# Patient Record
Sex: Female | Born: 1990 | Race: White | Hispanic: No | Marital: Married | Smoking: Never smoker
Health system: Southern US, Community
[De-identification: ages and names within clinical notes are randomized; demographics above are authoritative.]

## PROBLEM LIST (undated history)

## (undated) ENCOUNTER — Inpatient Hospital Stay (HOSPITAL_COMMUNITY): Payer: Self-pay

## (undated) DIAGNOSIS — F32A Depression, unspecified: Secondary | ICD-10-CM

## (undated) DIAGNOSIS — F329 Major depressive disorder, single episode, unspecified: Secondary | ICD-10-CM

## (undated) DIAGNOSIS — R011 Cardiac murmur, unspecified: Secondary | ICD-10-CM

## (undated) HISTORY — PX: TYMPANOSTOMY TUBE PLACEMENT: SHX32

## (undated) HISTORY — PX: TONSILLECTOMY: SUR1361

## (undated) HISTORY — DX: Cardiac murmur, unspecified: R01.1

---
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
## 2011-01-12 LAB — URINALYSIS, ROUTINE W REFLEX MICROSCOPIC
Nitrite: POSITIVE — AB
Specific Gravity, Urine: 1.027 (ref 1.005–1.030)
Urobilinogen, UA: 1 mg/dL (ref 0.0–1.0)

## 2011-01-12 LAB — URINE MICROSCOPIC-ADD ON

## 2014-06-21 ENCOUNTER — Ambulatory Visit (INDEPENDENT_AMBULATORY_CARE_PROVIDER_SITE_OTHER): Payer: BC Managed Care – PPO | Admitting: Family Medicine

## 2014-06-21 VITALS — BP 92/60 | HR 91 | Temp 98.0°F | Resp 18 | Ht 63.0 in | Wt 116.0 lb

## 2014-06-21 DIAGNOSIS — R634 Abnormal weight loss: Secondary | ICD-10-CM

## 2014-06-21 DIAGNOSIS — Z124 Encounter for screening for malignant neoplasm of cervix: Secondary | ICD-10-CM

## 2014-06-21 DIAGNOSIS — Z30011 Encounter for initial prescription of contraceptive pills: Secondary | ICD-10-CM

## 2014-06-21 DIAGNOSIS — Z3009 Encounter for other general counseling and advice on contraception: Secondary | ICD-10-CM

## 2014-06-21 DIAGNOSIS — R109 Unspecified abdominal pain: Secondary | ICD-10-CM

## 2014-06-21 DIAGNOSIS — Z0000 Encounter for general adult medical examination without abnormal findings: Secondary | ICD-10-CM

## 2014-06-21 LAB — COMPREHENSIVE METABOLIC PANEL
ALT: 15 U/L (ref 0–35)
AST: 21 U/L (ref 0–37)
Albumin: 4.1 g/dL (ref 3.5–5.2)
Alkaline Phosphatase: 53 U/L (ref 39–117)
BILIRUBIN TOTAL: 0.4 mg/dL (ref 0.2–1.2)
BUN: 4 mg/dL — ABNORMAL LOW (ref 6–23)
CO2: 29 meq/L (ref 19–32)
CREATININE: 0.74 mg/dL (ref 0.50–1.10)
Calcium: 9 mg/dL (ref 8.4–10.5)
Chloride: 102 mEq/L (ref 96–112)
GLUCOSE: 84 mg/dL (ref 70–99)
Potassium: 3.8 mEq/L (ref 3.5–5.3)
Sodium: 138 mEq/L (ref 135–145)
Total Protein: 6.3 g/dL (ref 6.0–8.3)

## 2014-06-21 LAB — POCT WET PREP WITH KOH
KOH Prep POC: NEGATIVE
RBC WET PREP PER HPF POC: NEGATIVE
Trichomonas, UA: NEGATIVE
Yeast Wet Prep HPF POC: NEGATIVE

## 2014-06-21 LAB — POCT UA - MICROSCOPIC ONLY
CASTS, UR, LPF, POC: NEGATIVE
CRYSTALS, UR, HPF, POC: NEGATIVE
Yeast, UA: NEGATIVE

## 2014-06-21 LAB — POCT CBC
Granulocyte percent: 65.8 %G (ref 37–80)
HEMATOCRIT: 47.3 % (ref 37.7–47.9)
HEMOGLOBIN: 15.2 g/dL (ref 12.2–16.2)
LYMPH, POC: 1.6 (ref 0.6–3.4)
MCH: 29.4 pg (ref 27–31.2)
MCHC: 32.2 g/dL (ref 31.8–35.4)
MCV: 91.2 fL (ref 80–97)
MID (cbc): 0.2 (ref 0–0.9)
MPV: 7.1 fL (ref 0–99.8)
POC Granulocyte: 3.4 (ref 2–6.9)
POC LYMPH PERCENT: 30.8 %L (ref 10–50)
POC MID %: 3.4 %M (ref 0–12)
Platelet Count, POC: 257 10*3/uL (ref 142–424)
RBC: 5.18 M/uL (ref 4.04–5.48)
RDW, POC: 13.3 %
WBC: 5.1 10*3/uL (ref 4.6–10.2)

## 2014-06-21 LAB — POCT URINALYSIS DIPSTICK
Glucose, UA: NEGATIVE
LEUKOCYTES UA: NEGATIVE
Nitrite, UA: NEGATIVE
PROTEIN UA: 30
Spec Grav, UA: 1.025
Urobilinogen, UA: 1
pH, UA: 6

## 2014-06-21 LAB — TSH: TSH: 0.829 u[IU]/mL (ref 0.350–4.500)

## 2014-06-21 LAB — POCT URINE PREGNANCY: Preg Test, Ur: NEGATIVE

## 2014-06-21 MED ORDER — NORGESTIMATE-ETH ESTRADIOL 0.25-35 MG-MCG PO TABS: 1.0000 | ORAL_TABLET | Freq: Every day | ORAL | Status: DC

## 2014-06-21 MED ORDER — ONDANSETRON 4 MG PO TBDP: 4.0000 mg | ORAL_TABLET | Freq: Three times a day (TID) | ORAL | Status: DC | PRN

## 2014-06-21 NOTE — Patient Instructions (Addendum)
Take ranitidine 150 mg twice a day for 2 weeks, can continue longer if needed.   Gastritis, Adult Gastritis is soreness and swelling (inflammation) of the lining of the stomach. Gastritis can develop as a sudden onset (acute) or long-term (chronic) condition. If gastritis is not treated, it can lead to stomach bleeding and ulcers. CAUSES  Gastritis occurs when the stomach lining is weak or damaged. Digestive juices from the stomach then inflame the weakened stomach lining. The stomach lining may be weak or damaged due to viral or bacterial infections. One common bacterial infection is the Helicobacter pylori infection. Gastritis can also result from excessive alcohol consumption, taking certain medicines, or having too much acid in the stomach.  SYMPTOMS  In some cases, there are no symptoms. When symptoms are present, they may include:  Pain or a burning sensation in the upper abdomen.  Nausea.  Vomiting.  An uncomfortable feeling of fullness after eating. DIAGNOSIS  Your caregiver may suspect you have gastritis based on your symptoms and a physical exam. To determine the cause of your gastritis, your caregiver may perform the following:  Blood or stool tests to check for the H pylori bacterium.  Gastroscopy. A thin, flexible tube (endoscope) is passed down the esophagus and into the stomach. The endoscope has a light and camera on the end. Your caregiver uses the endoscope to view the inside of the stomach.  Taking a tissue sample (biopsy) from the stomach to examine under a microscope. TREATMENT  Depending on the cause of your gastritis, medicines may be prescribed. If you have a bacterial infection, such as an H pylori infection, antibiotics may be given. If your gastritis is caused by too much acid in the stomach, H2 blockers or antacids may be given. Your caregiver may recommend that you stop taking aspirin, ibuprofen, or other nonsteroidal anti-inflammatory drugs (NSAIDs). HOME CARE  INSTRUCTIONS  Only take over-the-counter or prescription medicines as directed by your caregiver.  If you were given antibiotic medicines, take them as directed. Finish them even if you start to feel better.  Drink enough fluids to keep your urine clear or pale yellow.  Avoid foods and drinks that make your symptoms worse, such as:  Caffeine or alcoholic drinks.  Chocolate.  Peppermint or mint flavorings.  Garlic and onions.  Spicy foods.  Citrus fruits, such as oranges, lemons, or limes.  Tomato-based foods such as sauce, chili, salsa, and pizza.  Fried and fatty foods.  Eat small, frequent meals instead of large meals. SEEK IMMEDIATE MEDICAL CARE IF:   You have black or dark red stools.  You vomit blood or material that looks like coffee grounds.  You are unable to keep fluids down.  Your abdominal pain gets worse.  You have a fever.  You do not feel better after 1 week.  You have any other questions or concerns. MAKE SURE YOU:  Understand these instructions.  Will watch your condition.  Will get help right away if you are not doing well or get worse. Document Released: 09/18/2001 Document Revised: 03/25/2012 Document Reviewed: 11/07/2011 [facility] Patient Information 2015 [place], [place]. This information is not intended to replace advice given to you by your health care provider. Make sure you discuss any questions you have with your health care provider. Oral Contraception Use Oral contraceptive pills (OCPs) are medicines taken to prevent pregnancy. OCPs work by preventing the ovaries from releasing eggs. The hormones in OCPs also cause the cervical mucus to thicken, preventing the sperm from entering  the uterus. The hormones also cause the uterine lining to become thin, not allowing a fertilized egg to attach to the inside of the uterus. OCPs are highly effective when taken exactly as prescribed. However, OCPs do not prevent sexually transmitted diseases  (STDs). Safe sex practices, such as using condoms along with an OCP, can help prevent STDs. Before taking OCPs, you may have a physical exam and Pap test. Your health care provider may also order blood tests if necessary. Your health care provider will make sure you are a good candidate for oral contraception. Discuss with your health care provider the possible side effects of the OCP you may be prescribed. When starting an OCP, it can take 2 to 3 months for the body to adjust to the changes in hormone levels in your body.  HOW TO TAKE ORAL CONTRACEPTIVE PILLS Your health care provider may advise you on how to start taking the first cycle of OCPs. Otherwise, you can:   Start on day 1 of your menstrual period. You will not need any backup contraceptive protection with this start time.   Start on the first Sunday after your menstrual period or the day you get your prescription. In these cases, you will need to use backup contraceptive protection for the first week.   Start the pill at any time of your cycle. If you take the pill within 5 days of the start of your period, you are protected against pregnancy right away. In this case, you will not need a backup form of birth control. If you start at any other time of your menstrual cycle, you will need to use another form of birth control for 7 days. If your OCP is the type called a minipill, it will protect you from pregnancy after taking it for 2 days (48 hours). After you have started taking OCPs:   If you forget to take 1 pill, take it as soon as you remember. Take the next pill at the regular time.   If you miss 2 or more pills, call your health care provider because different pills have different instructions for missed doses. Use backup birth control until your next menstrual period starts.   If you use a 28-day pack that contains inactive pills and you miss 1 of the last 7 pills (pills with no hormones), it will not matter. Throw away the rest  of the non-hormone pills and start a new pill pack.  No matter which day you start the OCP, you will always start a new pack on that same day of the week. Have an extra pack of OCPs and a backup contraceptive method available in case you miss some pills or lose your OCP pack.  HOME CARE INSTRUCTIONS   Do not smoke.   Always use a condom to protect against STDs. OCPs do not protect against STDs.   Use a calendar to mark your menstrual period days.   Read the information and directions that came with your OCP. Talk to your health care provider if you have questions.  SEEK MEDICAL CARE IF:   You develop nausea and vomiting.   You have abnormal vaginal discharge or bleeding.   You develop a rash.   You miss your menstrual period.   You are losing your hair.   You need treatment for mood swings or depression.   You get dizzy when taking the OCP.   You develop acne from taking the OCP.   You become pregnant.  SEEK  IMMEDIATE MEDICAL CARE IF:   You develop chest pain.   You develop shortness of breath.   You have an uncontrolled or severe headache.   You develop numbness or slurred speech.   You develop visual problems.   You develop pain, redness, and swelling in the legs.  Document Released: 09/13/2011 Document Revised: 02/08/2014 Document Reviewed: 03/15/2013 [facility] Patient Information 2015 [place], [place]. This information is not intended to replace advice given to you by your health care provider. Make sure you discuss any questions you have with your health care provider.
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
## 2014-06-22 LAB — PAP IG, CT-NG, RFX HPV ASCU
Chlamydia Probe Amp: NEGATIVE
GC Probe Amp: NEGATIVE

## 2014-06-23 ENCOUNTER — Telehealth: Payer: Self-pay

## 2014-06-23 NOTE — Telephone Encounter (Signed)
Lab did call with results- result notes. LM to RTC since symptoms have returned.

## 2014-06-23 NOTE — Telephone Encounter (Signed)
Pt wants to know if her lab results are back.  Also she is concerned because her symptoms have came back which include diarrhea and vomiting.  [phone]

## 2014-09-10 ENCOUNTER — Ambulatory Visit (INDEPENDENT_AMBULATORY_CARE_PROVIDER_SITE_OTHER): Payer: BC Managed Care – PPO | Admitting: Emergency Medicine

## 2014-09-10 VITALS — BP 102/58 | HR 85 | Temp 98.4°F | Resp 16 | Ht 62.5 in | Wt 117.0 lb

## 2014-09-10 DIAGNOSIS — R300 Dysuria: Secondary | ICD-10-CM

## 2014-09-10 DIAGNOSIS — R350 Frequency of micturition: Secondary | ICD-10-CM

## 2014-09-10 LAB — POCT UA - MICROSCOPIC ONLY
CRYSTALS, UR, HPF, POC: NEGATIVE
Casts, Ur, LPF, POC: NEGATIVE
Mucus, UA: NEGATIVE
YEAST UA: NEGATIVE

## 2014-09-10 LAB — POCT URINALYSIS DIPSTICK
Bilirubin, UA: NEGATIVE
Glucose, UA: NEGATIVE
Ketones, UA: NEGATIVE
NITRITE UA: POSITIVE
PH UA: 7.5
PROTEIN UA: 30
Spec Grav, UA: 1.02
Urobilinogen, UA: 1

## 2014-09-10 MED ORDER — NITROFURANTOIN MONOHYD MACRO 100 MG PO CAPS
100.0000 mg | ORAL_CAPSULE | Freq: Two times a day (BID) | ORAL | Status: DC
Start: 2014-09-10 — End: 2015-01-28

## 2014-09-10 NOTE — Patient Instructions (Signed)
Your urine sample was positive for a uti. Please take the macrobid twice daily for 5 days. Please return to clinic if your symptoms don't resolve or if you start to have fevers or increased low back/side pain.

## 2014-09-10 NOTE — Progress Notes (Signed)
   Subjective:    Patient ID: Jennifer Randall, female    DOB: 07-08-1991, 23 y.o.   MRN: [id number]  PCP: No PCP Per Patient  Chief Complaint  Patient presents with  . Dysuria    cloudy and odor, x 1 week  . Flank Pain  . Urinary Frequency   There are no active problems to display for this patient.  Prior to Admission medications   Medication Sig Start Date End Date Taking? Authorizing Provider  norgestimate-ethinyl estradiol (ORTHO-CYCLEN,SPRINTEC,PREVIFEM) 0.25-35 MG-MCG tablet Take 1 tablet by mouth daily. 06/21/14  Yes Elby Beck, FNP  ondansetron (ZOFRAN ODT) 4 MG disintegrating tablet Take 1 tablet (4 mg total) by mouth every 8 (eight) hours as needed for nausea or vomiting. 06/21/14  Yes Elby Beck, FNP  nitrofurantoin, macrocrystal-monohydrate, (MACROBID) 100 MG capsule Take 1 capsule (100 mg total) by mouth 2 (two) times daily. 09/10/14   Araceli Bouche, PA   Medications, allergies, past medical history, surgical history, family history, social history and problem list reviewed and updated.  HPI  23 yof no sig PMH presents with dysuria.  Sx started several days ago with cloudy urine and foul smelling urine. Then 2 days ago began to have dysuria, increased freq and urgency. Started period few days ago so unsure if hematuria.   She mentions low back pain moreso left sided for past few days, but states this is typical for her around the time of menses. No fever, chills. Takes ocp daily. Sexually active with fiancee. Pos nausea but no vomiting or diarrhea.   Review of Systems No CP, SOB.     Objective:   Physical Exam  Results for orders placed or performed in visit on 09/10/14  POCT UA - Microscopic Only  Result Value Ref Range   WBC, Ur, HPF, POC 10-15    RBC, urine, microscopic 5-7    Bacteria, U Microscopic 3+    Mucus, UA neg    Epithelial cells, urine per micros 3-7    Crystals, Ur, HPF, POC neg    Casts, Ur, LPF, POC neg    Yeast, UA neg   POCT  urinalysis dipstick  Result Value Ref Range   Color, UA yellow    Clarity, UA cloudy    Glucose, UA neg    Bilirubin, UA neg    Ketones, UA neg    Spec Grav, UA 1.020    Blood, UA moderate    pH, UA 7.5    Protein, UA 30    Urobilinogen, UA 1.0    Nitrite, UA positive    Leukocytes, UA small (1+)       Assessment & Plan:   23 yof no sig PMH presents with dysuria.  Urinary frequency - Plan: POCT UA - Microscopic Only, POCT urinalysis dipstick, nitrofurantoin, macrocrystal-monohydrate, (MACROBID) 100 MG capsule Dysuria - Plan: POCT UA - Microscopic Only, POCT urinalysis dipstick, nitrofurantoin, macrocrystal-monohydrate, (MACROBID) 100 MG capsule, Urine culture --nitrites and leuks on ua --macrobid as has sulfa allergy --urine cx sent --low concern for pyelo at this time, rtc instructions given  Julieta Gutting, PA-C Physician Assistant-Certified [facility]  09/10/2014 6:08 PM

## 2014-09-14 LAB — URINE CULTURE: Colony Count: 85000

## 2015-01-28 ENCOUNTER — Ambulatory Visit (INDEPENDENT_AMBULATORY_CARE_PROVIDER_SITE_OTHER): Payer: BC Managed Care – PPO | Admitting: Urgent Care

## 2015-01-28 VITALS — BP 94/60 | HR 73 | Temp 98.1°F | Resp 16 | Ht 62.5 in | Wt 114.4 lb

## 2015-01-28 DIAGNOSIS — R319 Hematuria, unspecified: Secondary | ICD-10-CM

## 2015-01-28 DIAGNOSIS — K5900 Constipation, unspecified: Secondary | ICD-10-CM

## 2015-01-28 DIAGNOSIS — N390 Urinary tract infection, site not specified: Secondary | ICD-10-CM

## 2015-01-28 DIAGNOSIS — R102 Pelvic and perineal pain: Secondary | ICD-10-CM | POA: Diagnosis not present

## 2015-01-28 DIAGNOSIS — F329 Major depressive disorder, single episode, unspecified: Secondary | ICD-10-CM

## 2015-01-28 DIAGNOSIS — F32A Depression, unspecified: Secondary | ICD-10-CM

## 2015-01-28 LAB — POCT CBC
GRANULOCYTE PERCENT: 58.4 % (ref 37–80)
HEMATOCRIT: 39.6 % (ref 37.7–47.9)
HEMOGLOBIN: 13 g/dL (ref 12.2–16.2)
Lymph, poc: 3 (ref 0.6–3.4)
MCH, POC: 29.3 pg (ref 27–31.2)
MCHC: 32.8 g/dL (ref 31.8–35.4)
MCV: 89.3 fL (ref 80–97)
MID (cbc): 0.4 (ref 0–0.9)
MPV: 7.9 fL (ref 0–99.8)
POC GRANULOCYTE: 4.7 (ref 2–6.9)
POC LYMPH PERCENT: 36.7 %L (ref 10–50)
POC MID %: 4.9 % (ref 0–12)
Platelet Count, POC: 293 10*3/uL (ref 142–424)
RBC: 4.43 M/uL (ref 4.04–5.48)
RDW, POC: 13.4 %
WBC: 8.1 10*3/uL (ref 4.6–10.2)

## 2015-01-28 LAB — POCT URINALYSIS DIPSTICK
Bilirubin, UA: NEGATIVE
GLUCOSE UA: NEGATIVE
KETONES UA: NEGATIVE
Leukocytes, UA: NEGATIVE
Nitrite, UA: NEGATIVE
SPEC GRAV UA: 1.02
Urobilinogen, UA: 0.2
pH, UA: 7

## 2015-01-28 LAB — POCT UA - MICROSCOPIC ONLY
CRYSTALS, UR, HPF, POC: NEGATIVE
Casts, Ur, LPF, POC: NEGATIVE
Yeast, UA: NEGATIVE

## 2015-01-28 LAB — POCT URINE PREGNANCY: PREG TEST UR: NEGATIVE

## 2015-01-28 MED ORDER — NITROFURANTOIN MONOHYD MACRO 100 MG PO CAPS
100.0000 mg | ORAL_CAPSULE | Freq: Two times a day (BID) | ORAL | Status: DC
Start: 2015-01-28 — End: 2015-07-25

## 2015-01-28 MED ORDER — DOCUSATE SODIUM 50 MG PO CAPS: 50.0000 mg | ORAL_CAPSULE | Freq: Two times a day (BID) | ORAL | Status: DC

## 2015-01-28 NOTE — Patient Instructions (Signed)
Urinary Tract Infection Urinary tract infections (UTIs) can develop anywhere along your urinary tract. Your urinary tract is your body's drainage system for removing wastes and extra water. Your urinary tract includes two kidneys, two ureters, a bladder, and a urethra. Your kidneys are a pair of bean-shaped organs. Each kidney is about the size of your fist. They are located below your ribs, one on each side of your spine. CAUSES Infections are caused by microbes, which are microscopic organisms, including fungi, viruses, and bacteria. These organisms are so small that they can only be seen through a microscope. Bacteria are the microbes that most commonly cause UTIs. SYMPTOMS  Symptoms of UTIs may vary by age and gender of the patient and by the location of the infection. Symptoms in young women typically include a frequent and intense urge to urinate and a painful, burning feeling in the bladder or urethra during urination. Older women and men are more likely to be tired, shaky, and weak and have muscle aches and abdominal pain. A fever may mean the infection is in your kidneys. Other symptoms of a kidney infection include pain in your back or sides below the ribs, nausea, and vomiting. DIAGNOSIS To diagnose a UTI, your caregiver will ask you about your symptoms. Your caregiver also will ask to provide a urine sample. The urine sample will be tested for bacteria and white blood cells. White blood cells are made by your body to help fight infection. TREATMENT  Typically, UTIs can be treated with medication. Because most UTIs are caused by a bacterial infection, they usually can be treated with the use of antibiotics. The choice of antibiotic and length of treatment depend on your symptoms and the type of bacteria causing your infection. HOME CARE INSTRUCTIONS  If you were prescribed antibiotics, take them exactly as your caregiver instructs you. Finish the medication even if you feel better after you  have only taken some of the medication.  Drink enough water and fluids to keep your urine clear or pale yellow.  Avoid caffeine, tea, and carbonated beverages. They tend to irritate your bladder.  Empty your bladder often. Avoid holding urine for long periods of time.  Empty your bladder before and after sexual intercourse.  After a bowel movement, women should cleanse from front to back. Use each tissue only once. SEEK MEDICAL CARE IF:   You have back pain.  You develop a fever.  Your symptoms do not begin to resolve within 3 days. SEEK IMMEDIATE MEDICAL CARE IF:   You have severe back pain or lower abdominal pain.  You develop chills.  You have nausea or vomiting.  You have continued burning or discomfort with urination. MAKE SURE YOU:   Understand these instructions.  Will watch your condition.  Will get help right away if you are not doing well or get worse. Document Released: 07/04/2005 Document Revised: 03/25/2012 Document Reviewed: 11/02/2011 [facility] Patient Information 2015 [place], [place]. This information is not intended to replace advice given to you by your health care provider. Make sure you discuss any questions you have with your health care provider.    Citalopram tablets What is this medicine? CITALOPRAM (sye TAL oh pram) is a medicine for depression. This medicine may be used for other purposes; ask your health care provider or pharmacist if you have questions. COMMON BRAND NAME(S): Celexa What should I tell my health care provider before I take this medicine? They need to know if you have any of these conditions: -bipolar  disorder or a family history of bipolar disorder -diabetes -glaucoma -heart disease -history of irregular heartbeat -kidney or liver disease -low levels of magnesium or potassium in the blood -receiving electroconvulsive therapy -seizures (convulsions) -suicidal thoughts or a previous suicide attempt -an unusual or allergic  reaction to citalopram, escitalopram, other medicines, foods, dyes, or preservatives -pregnant or trying to become pregnant -breast-feeding How should I use this medicine? Take this medicine by mouth with a glass of water. Follow the directions on the prescription label. You can take it with or without food. Take your medicine at regular intervals. Do not take your medicine more often than directed. Do not stop taking this medicine suddenly except upon the advice of your doctor. Stopping this medicine too quickly may cause serious side effects or your condition may worsen. A special MedGuide will be given to you by the pharmacist with each prescription and refill. Be sure to read this information carefully each time. Talk to your pediatrician regarding the use of this medicine in children. Special care may be needed. Patients over 73 years old may have a stronger reaction and need a smaller dose. Overdosage: If you think you have taken too much of this medicine contact a poison control center or emergency room at once. NOTE: This medicine is only for you. Do not share this medicine with others. What if I miss a dose? If you miss a dose, take it as soon as you can. If it is almost time for your next dose, take only that dose. Do not take double or extra doses. What may interact with this medicine? Do not take this medicine with any of the following medications: -certain medicines for fungal infections like fluconazole, itraconazole, ketoconazole, posaconazole, voriconazole -cisapride -dofetilide -dronedarone -escitalopram -linezolid -MAOIs like Carbex, Eldepryl, Marplan, Nardil, and Parnate -methylene blue (injected into a vein) -pimozide -thioridazine -ziprasidone This medicine may also interact with the following medications: -alcohol -aspirin and aspirin-like medicines -carbamazepine -certain medicines for depression, anxiety, or psychotic disturbances -certain medicines for  infections like chloroquine, clarithromycin, erythromycin, furazolidone, isoniazid, pentamidine -certain medicines for migraine headaches like almotriptan, eletriptan, frovatriptan, naratriptan, rizatriptan, sumatriptan, zolmitriptan -certain medicines for sleep -certain medicines that treat or prevent blood clots like dalteparin, enoxaparin, warfarin -cimetidine -diuretics -fentanyl -lithium -methadone -metoprolol -NSAIDs, medicines for pain and inflammation, like ibuprofen or naproxen -omeprazole -other medicines that prolong the QT interval (cause an abnormal heart rhythm) -procarbazine -rasagiline -supplements like St. John's wort, kava kava, valerian -tramadol -tryptophan This list may not describe all possible interactions. Give your health care provider a list of all the medicines, herbs, non-prescription drugs, or dietary supplements you use. Also tell them if you smoke, drink alcohol, or use illegal drugs. Some items may interact with your medicine. What should I watch for while using this medicine? Tell your doctor if your symptoms do not get better or if they get worse. Visit your doctor or health care professional for regular checks on your progress. Because it may take several weeks to see the full effects of this medicine, it is important to continue your treatment as prescribed by your doctor. Patients and their families should watch out for new or worsening thoughts of suicide or depression. Also watch out for sudden changes in feelings such as feeling anxious, agitated, panicky, irritable, hostile, aggressive, impulsive, severely restless, overly excited and hyperactive, or not being able to sleep. If this happens, especially at the beginning of treatment or after a change in dose, call your health care professional. You  may get drowsy or dizzy. Do not drive, use machinery, or do anything that needs mental alertness until you know how this medicine affects you. Do not stand or  sit up quickly, especially if you are an older patient. This reduces the risk of dizzy or fainting spells. Alcohol may interfere with the effect of this medicine. Avoid alcoholic drinks. Your mouth may get dry. Chewing sugarless gum or sucking hard candy, and drinking plenty of water will help. Contact your doctor if the problem does not go away or is severe. What side effects may I notice from receiving this medicine? Side effects that you should report to your doctor or health care professional as soon as possible: -allergic reactions like skin rash, itching or hives, swelling of the face, lips, or tongue -chest pain -confusion -dizziness -fast, irregular heartbeat -fast talking and excited feelings or actions that are out of control -feeling faint or lightheaded, falls -hallucination, loss of contact with reality -seizures -shortness of breath -suicidal thoughts or other mood changes -unusual bleeding or bruising Side effects that usually do not require medical attention (report to your doctor or health care professional if they continue or are bothersome): -blurred vision -change in appetite -change in sex drive or performance -headache -increased sweating -nausea -trouble sleeping This list may not describe all possible side effects. Call your doctor for medical advice about side effects. You may report side effects to FDA at 1-800-FDA-1088. Where should I keep my medicine? Keep out of reach of children. Store at room temperature between 15 and 30 degrees C (59 and 86 degrees F). Throw away any unused medicine after the expiration date. NOTE: This sheet is a summary. It may not cover all possible information. If you have questions about this medicine, talk to your doctor, pharmacist, or health care provider.  2015, [employer]vier/[employer] Standard. (2013-04-17 13:19:48)

## 2015-01-28 NOTE — Progress Notes (Signed)
MRN: [id number] DOB: 12-31-1990  Subjective:   Jennifer Randall is a 24 y.o. female presenting for chief complaint of Abdominal Cramping  Reports several month history of abdominal cramping in LUQ, typically associated with her menstrual cycles, intermittent nausea without vomiting. States that she has had full work up before within the past year including CT imaging, was told that LUQ pain might be due to inflammation associated with her cramps. Also admits hard stools, bowel movements every other day, does not exercise or eat regularly and eats a mixed diet sometimes has stretches with just fast food. Has not tried any medications for relief. Denies fevers, chest pain, shob, cough, vomiting, flank pain, bloody stools, diarrhea, dysuria, hematuria, vaginal irritation, genital rashes.  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 Her father was diagnosed with lung cancer (adenocarcinoma) on 04/24/2014 and passed away at the beginning of 01/2015. 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 Lastly, the patient is also engaged and has been trying to follow through with her wedding which she had put off due to her father's passing but is now scheduled for 06/2015. She has not sought counseling nor been offered any type of mood medication.  Denies any other aggravating or relieving factors, no other questions or concerns.  Rabab currently has no medications in their medication list. He is allergic to sulfa antibiotics.  Jayleene  has a past medical history of Heart murmur. Also  has past surgical history that includes Tonsillectomy and Tympanostomy tube placement.  ROS As in subjective.  Objective:   Vitals: BP 94/60 mmHg  Pulse 73  Temp(Src) 98.1 F (36.7 C) (Oral)  Resp 16  Ht 5' 2.5" (1.588 m)  Wt 114 lb 6 oz (51.88 kg)  BMI 20.57 kg/m2   SpO2 100%  LMP 01/21/2015  Physical Exam  Constitutional: She is oriented to person, place, and time and well-developed, well-nourished, and in no distress.  HENT:  Mouth/Throat: Oropharynx is clear and moist. No oropharyngeal exudate.  Neck: No thyromegaly present.  Cardiovascular: Normal rate, regular rhythm and intact distal pulses.  Exam reveals no gallop and no friction rub.   No murmur heard. Pulmonary/Chest: No respiratory distress. She has no wheezes. She has no rales. She exhibits no tenderness.  Abdominal: Soft. Bowel sounds are normal. She exhibits no distension and no mass. There is tenderness (pelvic tenderness). There is no rebound and no guarding.  Neurological: She is alert and oriented to person, place, and time.  Skin: Skin is warm and dry. No rash noted. No erythema. No pallor.  Psychiatric: Her mood appears not anxious. Her affect is not blunt, not labile and not inappropriate. She is not agitated. She exhibits a depressed mood (had difficulty multiple times throughout visit talking about her level of stress, became tearful as well). She expresses no homicidal and no suicidal ideation. She is not apathetic. She has a flat affect.   Results for orders placed or performed in visit on 01/28/15 (from the past 24 hour(s))  POCT urine pregnancy     Status: None   Collection Time: 01/28/15  7:04 PM  Result Value Ref Range   Preg Test, Ur Negative   POCT UA - Microscopic Only     Status: None   Collection Time: 01/28/15  7:06 PM  Result Value Ref Range  WBC, Ur, HPF, POC 0-2    RBC, urine, microscopic 7-12    Bacteria, U Microscopic trace    Mucus, UA moderate    Epithelial cells, urine per micros 0-4    Crystals, Ur, HPF, POC neg    Casts, Ur, LPF, POC neg    Yeast, UA neg   POCT urinalysis dipstick     Status: None   Collection Time: 01/28/15  7:06 PM  Result Value Ref Range   Color, UA yellow    Clarity, UA clear    Glucose, UA neg    Bilirubin, UA neg     Ketones, UA neg    Spec Grav, UA 1.020    Blood, UA tr-lysed    pH, UA 7.0    Protein, UA trace    Urobilinogen, UA 0.2    Nitrite, UA neg    Leukocytes, UA Negative   POCT CBC     Status: None   Collection Time: 01/28/15  7:06 PM  Result Value Ref Range   WBC 8.1 4.6 - 10.2 K/uL   Lymph, poc 3.0 0.6 - 3.4   POC LYMPH PERCENT 36.7 10 - 50 %L   MID (cbc) 0.4 0 - 0.9   POC MID % 4.9 0 - 12 %M   POC Granulocyte 4.7 2 - 6.9   Granulocyte percent 58.4 37 - 80 %G   RBC 4.43 4.04 - 5.48 M/uL   Hemoglobin 13.0 12.2 - 16.2 g/dL   HCT, POC 39.6 37.7 - 47.9 %   MCV 89.3 80 - 97 fL   MCH, POC 29.3 27 - 31.2 pg   MCHC 32.8 31.8 - 35.4 g/dL   RDW, POC 13.4 %   Platelet Count, POC 293 142 - 424 K/uL   MPV 7.9 0 - 99.8 fL   Assessment and Plan :   1. Pelvic pain in female 2. Urinary tract infection with hematuria, site unspecified - Will treat for UTI with Macrobid. Unfortunately, I did not order a UTI and will communicate this oversight to patient, will ask to recheck urinalysis in 2 weeks. - Consider untreated UTI if symptoms persist, STI, renal stone. - Unclear etiology for LUQ pain, may be psychosomatic, see below.   3. Constipation, unspecified constipation type - Labs pending, constipation may be a source for her LUQ pain, recommended dietary modifications including exercise which may help improve her mood as well.  - Start docusate twice daily as needed.  4. Depression - Multi-system complaints may be manifestation of underlying depression. Discussed at length options for SSRI treatment, benefits of counseling, will look into hospice care including counseling for family members. - Patient will consider trial of Citalopram, follow up with labs.  Jaynee Eagles, PA-C [facility] [phone] 01/28/2015 6:05 PM

## 2015-01-29 LAB — COMPREHENSIVE METABOLIC PANEL
ALT: 12 U/L (ref 0–35)
AST: 12 U/L (ref 0–37)
Albumin: 4.2 g/dL (ref 3.5–5.2)
Alkaline Phosphatase: 46 U/L (ref 39–117)
BUN: 16 mg/dL (ref 6–23)
CALCIUM: 9.2 mg/dL (ref 8.4–10.5)
CHLORIDE: 102 meq/L (ref 96–112)
CO2: 27 meq/L (ref 19–32)
CREATININE: 0.7 mg/dL (ref 0.50–1.10)
Glucose, Bld: 89 mg/dL (ref 70–99)
Potassium: 4 mEq/L (ref 3.5–5.3)
SODIUM: 137 meq/L (ref 135–145)
TOTAL PROTEIN: 6.5 g/dL (ref 6.0–8.3)
Total Bilirubin: 0.4 mg/dL (ref 0.2–1.2)

## 2015-01-30 ENCOUNTER — Telehealth: Payer: Self-pay | Admitting: Urgent Care

## 2015-01-30 NOTE — Telephone Encounter (Signed)
Reported normal CMET.

## 2015-02-01 ENCOUNTER — Encounter: Payer: Self-pay | Admitting: Urgent Care

## 2015-02-01 ENCOUNTER — Telehealth: Payer: Self-pay

## 2015-02-01 DIAGNOSIS — F410 Panic disorder [episodic paroxysmal anxiety] without agoraphobia: Secondary | ICD-10-CM

## 2015-02-01 DIAGNOSIS — F419 Anxiety disorder, unspecified: Secondary | ICD-10-CM

## 2015-02-01 MED ORDER — SERTRALINE HCL 50 MG PO TABS
50.0000 mg | ORAL_TABLET | Freq: Every day | ORAL | Status: DC
Start: 2015-02-01 — End: 2015-07-25

## 2015-02-01 MED ORDER — LORAZEPAM 0.5 MG PO TABS: 0.5000 mg | ORAL_TABLET | Freq: Two times a day (BID) | ORAL | Status: DC | PRN

## 2015-02-01 NOTE — Telephone Encounter (Signed)
Patient reports that she would like to start SSRI therapy for anxiety and bereavement. 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 She had to spend ~30 minutes on the phone with her mother to help her calm down. She reports that her family is very worried about her anxiety and mood. I counseled patient and told her I would help her with this as initially planned. Will start Zoloft, as her mother has previously done well with this medication. Will also provide patient with lorazepam for anxiety attacks. Counseled patient on potential for adverse effects including pregnancy risks. Patient verbalized understanding and agreed to trial of Zoloft. She is to start with 50mg , call back in 1 week, we will potentially increase by 25-50mg . Start lorazepam twice a day as needed.

## 2015-02-01 NOTE — Telephone Encounter (Signed)
Pt returned call to [facility]. She states she will be at this number for the rest of the day. Ph: [phone]

## 2015-05-26 ENCOUNTER — Telehealth: Payer: Self-pay

## 2015-05-26 NOTE — Telephone Encounter (Signed)
Left VM to call back 

## 2015-05-26 NOTE — Telephone Encounter (Signed)
Has questions about her medication

## 2015-05-27 NOTE — Telephone Encounter (Signed)
Pt has been on antidepressants since early may.  Pt is feeling anxious at times before, but she is wanting to know if she can wean herself off of this medication.  Her mood is much better now.  She feels that she is in a much better place emotionally and spiritually and wants to try to come off of these.  Bess Harvest, please advise recs for this pt.  thanks

## 2015-06-01 NOTE — Telephone Encounter (Signed)
Spoke with patient. I am very happy that she is doing significantly better with her anxiety/depression. 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 Patient agreed. She will check in with me in 1 month.

## 2015-07-25 ENCOUNTER — Ambulatory Visit (INDEPENDENT_AMBULATORY_CARE_PROVIDER_SITE_OTHER): Payer: BC Managed Care – PPO | Admitting: Family Medicine

## 2015-07-25 VITALS — BP 102/64 | HR 110 | Temp 98.6°F | Resp 18 | Ht 63.5 in | Wt 120.0 lb

## 2015-07-25 DIAGNOSIS — R300 Dysuria: Secondary | ICD-10-CM | POA: Diagnosis not present

## 2015-07-25 DIAGNOSIS — R319 Hematuria, unspecified: Secondary | ICD-10-CM

## 2015-07-25 DIAGNOSIS — N390 Urinary tract infection, site not specified: Secondary | ICD-10-CM | POA: Diagnosis not present

## 2015-07-25 DIAGNOSIS — F32A Depression, unspecified: Secondary | ICD-10-CM

## 2015-07-25 DIAGNOSIS — F329 Major depressive disorder, single episode, unspecified: Secondary | ICD-10-CM | POA: Diagnosis not present

## 2015-07-25 DIAGNOSIS — F419 Anxiety disorder, unspecified: Secondary | ICD-10-CM

## 2015-07-25 LAB — POC MICROSCOPIC URINALYSIS (UMFC): MUCUS RE: ABSENT

## 2015-07-25 LAB — POCT URINALYSIS DIP (MANUAL ENTRY)
Bilirubin, UA: NEGATIVE
GLUCOSE UA: NEGATIVE
Ketones, POC UA: NEGATIVE
NITRITE UA: POSITIVE — AB
PH UA: 7
PROTEIN UA: NEGATIVE
SPEC GRAV UA: 1.015
UROBILINOGEN UA: 0.2

## 2015-07-25 MED ORDER — NITROFURANTOIN MONOHYD MACRO 100 MG PO CAPS: 100.0000 mg | ORAL_CAPSULE | Freq: Two times a day (BID) | ORAL | Status: DC

## 2015-07-25 MED ORDER — SERTRALINE HCL 50 MG PO TABS: 50.0000 mg | ORAL_TABLET | Freq: Every day | ORAL | Status: DC

## 2015-07-25 NOTE — Patient Instructions (Signed)
Take the nitrofurantoin 100 mg 1 twice daily  Drink plenty of fluids  Always void after sexual intercourse as discussed  Continue your sertraline 1 daily. My recommendation is that you consider waiting until spring to discontinue this.  Plan see Rosario Adie PA-C in about January for a follow-up. You can call and see if you can get an appointment with him at [address]
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
## 2015-10-06 ENCOUNTER — Ambulatory Visit (INDEPENDENT_AMBULATORY_CARE_PROVIDER_SITE_OTHER): Payer: BC Managed Care – PPO | Admitting: Physician Assistant

## 2015-10-06 VITALS — BP 108/62 | HR 82 | Temp 98.7°F | Resp 16 | Ht 63.5 in | Wt 126.8 lb

## 2015-10-06 DIAGNOSIS — Z3201 Encounter for pregnancy test, result positive: Secondary | ICD-10-CM | POA: Diagnosis not present

## 2015-10-06 DIAGNOSIS — Z3200 Encounter for pregnancy test, result unknown: Secondary | ICD-10-CM

## 2015-10-06 DIAGNOSIS — Z3490 Encounter for supervision of normal pregnancy, unspecified, unspecified trimester: Secondary | ICD-10-CM

## 2015-10-06 LAB — POCT URINE PREGNANCY: Preg Test, Ur: POSITIVE — AB

## 2015-10-06 NOTE — Patient Instructions (Signed)
You can take tylenol during pregnancy, not ibuprofen All meats cooked well done No soft cheeses No deli meat Return if you have abdominal pain or vaginal bleeding.  [facility] - Dr. Prescott [facility] - Dr. Garwin Brothers, Dr. Ronita Hipps  First Trimester of Pregnancy The first trimester of pregnancy is from week 1 until the end of week 12 (months 1 through 3). A week after a sperm fertilizes an egg, the egg will implant on the wall of the uterus. This embryo will begin to develop into a baby. Genes from you and your partner are forming the baby. The female genes determine whether the baby is a boy or a girl. At 6-8 weeks, the eyes and face are formed, and the heartbeat can be seen on ultrasound. At the end of 12 weeks, all the baby's organs are formed.  Now that you are pregnant, you will want to do everything you can to have a healthy baby. Two of the most important things are to get good prenatal care and to follow your health care provider's instructions. Prenatal care is all the medical care you receive before the baby's birth. This care will help prevent, find, and treat any problems during the pregnancy and childbirth. BODY CHANGES Your body goes through many changes during pregnancy. The changes vary from woman to woman.   You may gain or lose a couple of pounds at first.  You may feel sick to your stomach (nauseous) and throw up (vomit). If the vomiting is uncontrollable, call your health care provider.  You may tire easily.  You may develop headaches that can be relieved by medicines approved by your health care provider.  You may urinate more often. Painful urination may mean you have a bladder infection.  You may develop heartburn as a result of your pregnancy.  You may develop constipation because certain hormones are causing the muscles that push waste through your intestines to slow down.  You may develop hemorrhoids or swollen, bulging veins (varicose  veins).  Your breasts may begin to grow larger and become tender. Your nipples may stick out more, and the tissue that surrounds them (areola) may become darker.  Your gums may bleed and may be sensitive to brushing and flossing.  Dark spots or blotches (chloasma, mask of pregnancy) may develop on your face. This will likely fade after the baby is born.  Your menstrual periods will stop.  You may have a loss of appetite.  You may develop cravings for certain kinds of food.  You may have changes in your emotions from day to day, such as being excited to be pregnant or being concerned that something may go wrong with the pregnancy and baby.  You may have more vivid and strange dreams.  You may have changes in your hair. These can include thickening of your hair, rapid growth, and changes in texture. Some women also have hair loss during or after pregnancy, or hair that feels dry or thin. Your hair will most likely return to normal after your baby is born. WHAT TO EXPECT AT YOUR PRENATAL VISITS During a routine prenatal visit:  You will be weighed to make sure you and the baby are growing normally.  Your blood pressure will be taken.  Your abdomen will be measured to track your baby's growth.  The fetal heartbeat will be listened to starting around week 10 or 12 of your pregnancy.  Test results from any previous visits will be discussed. Your health  care provider may ask you:  How you are feeling.  If you are feeling the baby move.  If you have had any abnormal symptoms, such as leaking fluid, bleeding, severe headaches, or abdominal cramping.  If you are using any tobacco products, including cigarettes, chewing tobacco, and electronic cigarettes.  If you have any questions. Other tests that may be performed during your first trimester include:  Blood tests to find your blood type and to check for the presence of any previous infections. They will also be used to check for  low iron levels (anemia) and Rh antibodies. Later in the pregnancy, blood tests for diabetes will be done along with other tests if problems develop.  Urine tests to check for infections, diabetes, or protein in the urine.  An ultrasound to confirm the proper growth and development of the baby.  An amniocentesis to check for possible genetic problems.  Fetal screens for spina bifida and Down syndrome.  You may need other tests to make sure you and the baby are doing well.  HIV (human immunodeficiency virus) testing. Routine prenatal testing includes screening for HIV, unless you choose not to have this test. HOME CARE INSTRUCTIONS  Medicines  Follow your health care provider's instructions regarding medicine use. Specific medicines may be either safe or unsafe to take during pregnancy.  Take your prenatal vitamins as directed.  If you develop constipation, try taking a stool softener if your health care provider approves. Diet  Eat regular, well-balanced meals. Choose a variety of foods, such as meat or vegetable-based protein, fish, milk and low-fat dairy products, vegetables, fruits, and whole grain breads and cereals. Your health care provider will help you determine the amount of weight gain that is right for you.  Avoid raw meat and uncooked cheese. These carry germs that can cause birth defects in the baby.  Eating four or five small meals rather than three large meals a day may help relieve nausea and vomiting. If you start to feel nauseous, eating a few soda crackers can be helpful. Drinking liquids between meals instead of during meals also seems to help nausea and vomiting.  If you develop constipation, eat more high-fiber foods, such as fresh vegetables or fruit and whole grains. Drink enough fluids to keep your urine clear or pale yellow. Activity and Exercise  Exercise only as directed by your health care provider. Exercising will help you:  Control your weight.  Stay  in shape.  Be prepared for labor and delivery.  Experiencing pain or cramping in the lower abdomen or low back is a good sign that you should stop exercising. Check with your health care provider before continuing normal exercises.  Try to avoid standing for long periods of time. Move your legs often if you must stand in one place for a long time.  Avoid heavy lifting.  Wear low-heeled shoes, and practice good posture.  You may continue to have sex unless your health care provider directs you otherwise. Relief of Pain or Discomfort  Wear a good support bra for breast tenderness.   Take warm sitz baths to soothe any pain or discomfort caused by hemorrhoids. Use hemorrhoid cream if your health care provider approves.   Rest with your legs elevated if you have leg cramps or low back pain.  If you develop varicose veins in your legs, wear support hose. Elevate your feet for 15 minutes, 3-4 times a day. Limit salt in your diet. Prenatal Care  Schedule your prenatal visits  by the twelfth week of pregnancy. They are usually scheduled monthly at first, then more often in the last 2 months before delivery.  Write down your questions. Take them to your prenatal visits.  Keep all your prenatal visits as directed by your health care provider. Safety  Wear your seat belt at all times when driving.  Make a list of emergency phone numbers, including numbers for family, friends, the hospital, and police and fire departments. General Tips  Ask your health care provider for a referral to a local prenatal education class. Begin classes no later than at the beginning of month 6 of your pregnancy.  Ask for help if you have counseling or nutritional needs during pregnancy. Your health care provider can offer advice or refer you to specialists for help with various needs.  Do not use hot tubs, steam rooms, or saunas.  Do not douche or use tampons or scented sanitary pads.  Do not cross your  legs for long periods of time.  Avoid cat litter boxes and soil used by cats. These carry germs that can cause birth defects in the baby and possibly loss of the fetus by miscarriage or stillbirth.  Avoid all smoking, herbs, alcohol, and medicines not prescribed by your health care provider. Chemicals in these affect the formation and growth of the baby.  Do not use any tobacco products, including cigarettes, chewing tobacco, and electronic cigarettes. If you need help quitting, ask your health care provider. You may receive counseling support and other resources to help you quit.  Schedule a dentist appointment. At home, brush your teeth with a soft toothbrush and be gentle when you floss. SEEK MEDICAL CARE IF:   You have dizziness.  You have mild pelvic cramps, pelvic pressure, or nagging pain in the abdominal area.  You have persistent nausea, vomiting, or diarrhea.  You have a bad smelling vaginal discharge.  You have pain with urination.  You notice increased swelling in your face, hands, legs, or ankles. SEEK IMMEDIATE MEDICAL CARE IF:   You have a fever.  You are leaking fluid from your vagina.  You have spotting or bleeding from your vagina.  You have severe abdominal cramping or pain.  You have rapid weight gain or loss.  You vomit blood or material that looks like coffee grounds.  You are exposed to [place] measles and have never had them.  You are exposed to fifth disease or chickenpox.  You develop a severe headache.  You have shortness of breath.  You have any kind of trauma, such as from a fall or a car accident.   This information is not intended to replace advice given to you by your health care provider. Make sure you discuss any questions you have with your health care provider.   Document Released: 09/18/2001 Document Revised: 10/15/2014 Document Reviewed: 08/04/2013 Elsevier Interactive Patient Education [employer].

## 2015-10-06 NOTE — Progress Notes (Signed)
[facility] [address] [place] [address] [phone]  Date:  10/06/2015   Name:  Jennifer Randall   DOB:  July 09, 1991   MRN:  [id number]  PCP:  No PCP Per Patient    Chief Complaint: pregnancy test   History of Present Illness:  This is a 24 y.o. female with PMH anxiety who is presenting after two positive home pregnancy tests. She is here with her significant other. This was a planned pregnancy and she is very excited about it. This is her first pregnancy. Only symptoms so far breast tenderness and some nausea yesterday. She had stopped all meds (zoloft, ativan) 2 weeks prior to finding out she was pregnant. She is doing well off these meds. She denies abdominal pain, vaginal bleeding. LMP 09/01/15 She is taking prenatal vitamins daily.  Review of Systems:  Review of Systems See HPI  There are no active problems to display for this patient.  Home meds: None   Allergies  Allergen Reactions  . Sulfa Antibiotics Hives    Past Surgical History  Procedure Laterality Date  . Tonsillectomy    . Tympanostomy tube placement      Social History  Substance Use Topics  . Smoking status: Never Smoker   . Smokeless tobacco: None  . Alcohol Use: No    Family History  Problem Relation Age of Onset  . Cancer Father   . Hyperlipidemia Maternal Grandmother     Medication list has been reviewed and updated.  Physical Examination:  Physical Exam  Constitutional: She is oriented to person, place, and time. She appears well-developed and well-nourished. No distress.  HENT:  Head: Normocephalic and atraumatic.  Right Ear: Hearing normal.  Left Ear: Hearing normal.  Nose: Nose normal.  Eyes: Conjunctivae and lids are normal. Right eye exhibits no discharge. Left eye exhibits no discharge. No scleral icterus.  Pulmonary/Chest: Effort normal. No respiratory distress.  Musculoskeletal: Normal range of motion.  Neurological: She is alert and  oriented to person, place, and time.  Skin: Skin is warm, dry and intact. No lesion and no rash noted.  Psychiatric: She has a normal mood and affect. Her speech is normal and behavior is normal. Thought content normal.    BP 108/62 mmHg  Pulse 82  Temp(Src) 98.7 F (37.1 C) (Oral)  Resp 16  Ht 5' 3.5" (1.613 m)  Wt 126 lb 12.8 oz (57.516 kg)  BMI 22.11 kg/m2  SpO2 99%  LMP 09/01/2015  Results for orders placed or performed in visit on 10/06/15  POCT urine pregnancy  Result Value Ref Range   Preg Test, Ur Positive (A) Negative    Assessment and Plan:  1. Pregnancy 2. Possible pregnancy Pregnancy test +. We discussed foods to avoid. We discussed symptoms to watch for. Gave contact information for OB/GYN practices. Return as needed. - POCT urine pregnancy   Benjaman Pott. Drenda Freeze, MHS [facility]  10/06/2015

## 2015-10-09 NOTE — L&D Delivery Note (Addendum)
Patient was C/C/+2 and pushed for 2 hours 45 minutes with epidural.    NSVD  female infant, Apgars 7,9, weight P.   The patient had a second degree midline episiotomy done to shorten last bit of pushing with tight hymen and in case of shoulder dystocia -repaired with 2-0 vicryl R. Fundus was firm. EBL was expected amount. Placenta was delivered intact. Vagina was clear.  Baby was vigorous and doing skin to skin with mother. Pt had fever during labor and treated with Amp for possible chorio- antibiotics discontinued for now.  Dre Gamino A

## 2015-11-16 LAB — OB RESULTS CONSOLE HEPATITIS B SURFACE ANTIGEN: HEP B S AG: NEGATIVE

## 2015-11-16 LAB — OB RESULTS CONSOLE GC/CHLAMYDIA
CHLAMYDIA, DNA PROBE: NEGATIVE
Gonorrhea: NEGATIVE

## 2015-11-16 LAB — OB RESULTS CONSOLE RUBELLA ANTIBODY, IGM: RUBELLA: IMMUNE

## 2015-11-16 LAB — OB RESULTS CONSOLE HIV ANTIBODY (ROUTINE TESTING): HIV: NONREACTIVE

## 2015-11-16 LAB — OB RESULTS CONSOLE RPR: RPR: NONREACTIVE

## 2016-03-14 ENCOUNTER — Ambulatory Visit (INDEPENDENT_AMBULATORY_CARE_PROVIDER_SITE_OTHER): Payer: BC Managed Care – PPO | Admitting: Physician Assistant

## 2016-03-14 VITALS — BP 108/68 | HR 81 | Temp 98.0°F | Resp 17 | Ht 63.5 in | Wt 144.0 lb

## 2016-03-14 DIAGNOSIS — R070 Pain in throat: Secondary | ICD-10-CM

## 2016-03-14 LAB — POCT RAPID STREP A (OFFICE): Rapid Strep A Screen: NEGATIVE

## 2016-03-14 NOTE — Patient Instructions (Addendum)
IF you received an x-ray today, you will receive an invoice from [facility]. Please contact [facility] at [phone] with questions or concerns regarding your invoice.   IF you received labwork today, you will receive an invoice from [employer]. Please contact [facility] at [phone] with questions or concerns regarding your invoice.   Our billing staff will not be able to assist you with questions regarding bills from these companies.  You will be contacted with the lab results as soon as they are available. The fastest way to get your results is to activate your My Chart account. Instructions are located on the last page of this paperwork. If you have not heard from [place] regarding the results in 2 weeks, please contact this office.    Please use nasal saline spray for your nose.  nedi pot would also be good.  You can use cepacol lozenges for the throat pain.   You can also use tylenol. Please stop the sudafed at this time. Upper Respiratory Infection, Adult Most upper respiratory infections (URIs) are a viral infection of the air passages leading to the lungs. A URI affects the nose, throat, and upper air passages. The most common type of URI is nasopharyngitis and is typically referred to as "the common cold." URIs run their course and usually go away on their own. Most of the time, a URI does not require medical attention, but sometimes a bacterial infection in the upper airways can follow a viral infection. This is called a secondary infection. Sinus and middle ear infections are common types of secondary upper respiratory infections. Bacterial pneumonia can also complicate a URI. A URI can worsen asthma and chronic obstructive pulmonary disease (COPD). Sometimes, these complications can require emergency medical care and may be life threatening.  CAUSES Almost all URIs are caused by viruses. A virus is a type of germ and can spread from  one person to another.  RISKS FACTORS You may be at risk for a URI if:   You smoke.   You have chronic heart or lung disease.  You have a weakened defense (immune) system.   You are very young or very old.   You have nasal allergies or asthma.  You work in crowded or poorly ventilated areas.  You work in health care facilities or schools. SIGNS AND SYMPTOMS  Symptoms typically develop 2-3 days after you come in contact with a cold virus. Most viral URIs last 7-10 days. However, viral URIs from the influenza virus (flu virus) can last 14-18 days and are typically more severe. Symptoms may include:   Runny or stuffy (congested) nose.   Sneezing.   Cough.   Sore throat.   Headache.   Fatigue.   Fever.   Loss of appetite.   Pain in your forehead, behind your eyes, and over your cheekbones (sinus pain).  Muscle aches.  DIAGNOSIS  Your health care provider may diagnose a URI by:  Physical exam.  Tests to check that your symptoms are not due to another condition such as:  Strep throat.  Sinusitis.  Pneumonia.  Asthma. TREATMENT  A URI goes away on its own with time. It cannot be cured with medicines, but medicines may be prescribed or recommended to relieve symptoms. Medicines may help:  Reduce your fever.  Reduce your cough.  Relieve nasal congestion. HOME CARE INSTRUCTIONS   Take medicines only as directed by your health care provider.   Gargle warm saltwater or take  cough drops to comfort your throat as directed by your health care provider.  Use a warm mist humidifier or inhale steam from a shower to increase air moisture. This may make it easier to breathe.  Drink enough fluid to keep your urine clear or pale yellow.   Eat soups and other clear broths and maintain good nutrition.   Rest as needed.   Return to work when your temperature has returned to normal or as your health care provider advises. You may need to stay home  longer to avoid infecting others. You can also use a face mask and careful hand washing to prevent spread of the virus.  Increase the usage of your inhaler if you have asthma.   Do not use any tobacco products, including cigarettes, chewing tobacco, or electronic cigarettes. If you need help quitting, ask your health care provider. PREVENTION  The best way to protect yourself from getting a cold is to practice good hygiene.   Avoid oral or hand contact with people with cold symptoms.   Wash your hands often if contact occurs.  There is no clear evidence that vitamin C, vitamin E, echinacea, or exercise reduces the chance of developing a cold. However, it is always recommended to get plenty of rest, exercise, and practice good nutrition.  SEEK MEDICAL CARE IF:   You are getting worse rather than better.   Your symptoms are not controlled by medicine.   You have chills.  You have worsening shortness of breath.  You have brown or red mucus.  You have yellow or brown nasal discharge.  You have pain in your face, especially when you bend forward.  You have a fever.  You have swollen neck glands.  You have pain while swallowing.  You have white areas in the back of your throat. SEEK IMMEDIATE MEDICAL CARE IF:   You have severe or persistent:  Headache.  Ear pain.  Sinus pain.  Chest pain.  You have chronic lung disease and any of the following:  Wheezing.  Prolonged cough.  Coughing up blood.  A change in your usual mucus.  You have a stiff neck.  You have changes in your:  Vision.  Hearing.  Thinking.  Mood. MAKE SURE YOU:   Understand these instructions.  Will watch your condition.  Will get help right away if you are not doing well or get worse.   This information is not intended to replace advice given to you by your health care provider. Make sure you discuss any questions you have with your health care provider.   Document Released:  03/20/2001 Document Revised: 02/08/2015 Document Reviewed: 12/30/2013 Elsevier Interactive Patient Education [employer].

## 2016-03-16 LAB — CULTURE, GROUP A STREP: Organism ID, Bacteria: NORMAL

## 2016-04-01 NOTE — Progress Notes (Signed)
[facility] [address] [place] [address] [phone]  Date:  03/14/2016   Name:  Jennifer Randall   DOB:  1991/06/20   MRN:  [id number]  PCP:  No PCP Per Patient    History of Present Illness:  Jennifer Randall is a 25 y.o. gravida female patient who presents to [facility] for cc of sore throat.   Patient has sore throat, cough, but without production.  No sob or dyspnea.  She noticed white in the back of her throat.  No abdominal pain or cramping.  She has done nothing for relief.    There are no active problems to display for this patient.   Past Medical History  Diagnosis Date  . Heart murmur     Past Surgical History  Procedure Laterality Date  . Tonsillectomy    . Tympanostomy tube placement      Social History  Substance Use Topics  . Smoking status: Never Smoker   . Smokeless tobacco: None  . Alcohol Use: No    Family History  Problem Relation Age of Onset  . Cancer Father   . Hyperlipidemia Maternal Grandmother     Allergies  Allergen Reactions  . Sulfa Antibiotics Hives    Medication list has been reviewed and updated.  No current outpatient prescriptions on file prior to visit.   No current facility-administered medications on file prior to visit.    ROS ROS otherwise unremarkable unless listed above.   Physical Examination: BP 108/68 mmHg  Pulse 81  Temp(Src) 98 F (36.7 C) (Oral)  Resp 17  Ht 5' 3.5" (1.613 m)  Wt 144 lb (65.318 kg)  BMI 25.11 kg/m2  SpO2 98%  LMP 09/01/2015 Ideal Body Weight: Weight in (lb) to have BMI = 25: 143.1  Physical Exam  Constitutional: She is oriented to person, place, and time. She appears well-developed and well-nourished. No distress.  HENT:  Head: Normocephalic and atraumatic.  Right Ear: Tympanic membrane, external ear and ear canal normal.  Left Ear: Tympanic membrane, external ear and ear canal normal.  Nose: No mucosal edema or rhinorrhea. Right sinus exhibits no  maxillary sinus tenderness and no frontal sinus tenderness. Left sinus exhibits no maxillary sinus tenderness and no frontal sinus tenderness.  Mouth/Throat: No uvula swelling. No oropharyngeal exudate, posterior oropharyngeal edema or posterior oropharyngeal erythema.  Eyes: Conjunctivae and EOM are normal. Pupils are equal, round, and reactive to light.  Cardiovascular: Normal rate and regular rhythm.  Exam reveals no gallop, no distant heart sounds and no friction rub.   No murmur heard. Pulmonary/Chest: Effort normal. No respiratory distress. She has no decreased breath sounds. She has no wheezes. She has no rhonchi.  Lymphadenopathy:       Head (right side): No submandibular, no tonsillar, no preauricular and no posterior auricular adenopathy present.       Head (left side): No submandibular, no tonsillar, no preauricular and no posterior auricular adenopathy present.  Neurological: She is alert and oriented to person, place, and time.  Skin: She is not diaphoretic.  Psychiatric: She has a normal mood and affect. Her behavior is normal.    Results for orders placed or performed in visit on 03/14/16  Culture, Group A Strep  Result Value Ref Range   Organism ID, Bacteria Normal Upper Respiratory Flora    Organism ID, Bacteria No Beta Hemolytic Streptococci Isolated   POCT rapid strep A  Result Value Ref Range   Rapid Strep A  Screen Negative Negative    Assessment and Plan: Jennifer Randall is a 25 y.o. female who is here today for throat pain. Advised tylenol, nasal saline spray, and nedi pot.   This appears to be post-nasal drip that is bothering the back of her throat.    Throat pain - Plan: POCT rapid strep A, Culture, Group A Strep   Ivar Drape, PA-C [facility] 04/01/2016 8:53 PM

## 2016-05-11 LAB — OB RESULTS CONSOLE GBS: GBS: NEGATIVE

## 2016-06-08 ENCOUNTER — Encounter (HOSPITAL_COMMUNITY): Payer: Self-pay | Admitting: *Deleted

## 2016-06-08 ENCOUNTER — Inpatient Hospital Stay (HOSPITAL_COMMUNITY)
Admission: AD | Admit: 2016-06-08 | Discharge: 2016-06-09 | Disposition: A | Discharge status: Home / Self Care | Payer: BC Managed Care – PPO | Source: Ambulatory Visit | Attending: Obstetrics and Gynecology | Admitting: Obstetrics and Gynecology

## 2016-06-08 DIAGNOSIS — Z3A40 40 weeks gestation of pregnancy: Secondary | ICD-10-CM | POA: Insufficient documentation

## 2016-06-08 DIAGNOSIS — O471 False labor at or after 37 completed weeks of gestation: Secondary | ICD-10-CM | POA: Insufficient documentation

## 2016-06-08 NOTE — MAU Note (Signed)
Contractions since 2000. Denies LOF or bleeding. 0.5cm last sve

## 2016-06-09 ENCOUNTER — Encounter (HOSPITAL_COMMUNITY): Payer: Self-pay

## 2016-06-09 NOTE — Progress Notes (Signed)
Report called to Dr. Harrington Challenger: G1, 40.2 wks, SVE 1/60/-3, contractions q 2-3 min; was 0.5 cm in the office yesterday. Pt may walk for an hour and be rechecked.

## 2016-06-09 NOTE — Progress Notes (Signed)
Orders received to D/C home with labor precautions.

## 2016-06-09 NOTE — MAU Note (Signed)
Pt back to room from walking; EFM reapplied

## 2016-06-10 ENCOUNTER — Inpatient Hospital Stay (HOSPITAL_COMMUNITY): Payer: BC Managed Care – PPO | Admitting: Anesthesiology

## 2016-06-10 ENCOUNTER — Inpatient Hospital Stay (HOSPITAL_COMMUNITY)
Admission: AD | Admit: 2016-06-10 | Discharge: 2016-06-14 | DRG: 775 | Disposition: A | Discharge status: Home / Self Care | Payer: BC Managed Care – PPO | Source: Ambulatory Visit | Attending: Obstetrics and Gynecology | Admitting: Obstetrics and Gynecology

## 2016-06-10 ENCOUNTER — Encounter (HOSPITAL_COMMUNITY): Payer: Self-pay | Admitting: Certified Nurse Midwife

## 2016-06-10 DIAGNOSIS — O411230 Chorioamnionitis, third trimester, not applicable or unspecified: Secondary | ICD-10-CM | POA: Diagnosis present

## 2016-06-10 DIAGNOSIS — Z3A40 40 weeks gestation of pregnancy: Secondary | ICD-10-CM | POA: Diagnosis not present

## 2016-06-10 DIAGNOSIS — Z3403 Encounter for supervision of normal first pregnancy, third trimester: Secondary | ICD-10-CM | POA: Diagnosis present

## 2016-06-10 LAB — CBC
HCT: 35.3 % — ABNORMAL LOW (ref 36.0–46.0)
HEMOGLOBIN: 12 g/dL (ref 12.0–15.0)
MCH: 29.9 pg (ref 26.0–34.0)
MCHC: 34 g/dL (ref 30.0–36.0)
MCV: 87.8 fL (ref 78.0–100.0)
PLATELETS: 127 10*3/uL — AB (ref 150–400)
RBC: 4.02 MIL/uL (ref 3.87–5.11)
RDW: 13.7 % (ref 11.5–15.5)
WBC: 11.1 10*3/uL — AB (ref 4.0–10.5)

## 2016-06-10 LAB — ABO/RH: ABO/RH(D): O POS

## 2016-06-10 LAB — TYPE AND SCREEN
ABO/RH(D): O POS
Antibody Screen: NEGATIVE

## 2016-06-10 MED ORDER — OXYCODONE-ACETAMINOPHEN 5-325 MG PO TABS
1.0000 | ORAL_TABLET | ORAL | Status: DC | PRN
  Filled 2016-06-10: qty 1

## 2016-06-10 MED ORDER — LIDOCAINE HCL (PF) 1 % IJ SOLN
30.0000 mL | INTRAMUSCULAR | Status: DC | PRN
  Administered 2016-06-12: 30 mL via SUBCUTANEOUS
  Filled 2016-06-10: qty 30

## 2016-06-10 MED ORDER — LACTATED RINGERS IV SOLN: 500.0000 mL | Freq: Once | INTRAVENOUS | Status: DC

## 2016-06-10 MED ORDER — FENTANYL 2.5 MCG/ML BUPIVACAINE 1/10 % EPIDURAL INFUSION (WH - ANES)
INTRAMUSCULAR | Status: AC
  Administered 2016-06-10: 14 mL/h via EPIDURAL
  Filled 2016-06-10: qty 125

## 2016-06-10 MED ORDER — EPHEDRINE 5 MG/ML INJ
10.0000 mg | INTRAVENOUS | Status: DC | PRN
  Filled 2016-06-10: qty 4

## 2016-06-10 MED ORDER — LACTATED RINGERS IV SOLN
500.0000 mL | INTRAVENOUS | Status: DC | PRN
  Administered 2016-06-11: 1000 mL via INTRAVENOUS

## 2016-06-10 MED ORDER — OXYTOCIN BOLUS FROM INFUSION
500.0000 mL | Freq: Once | INTRAVENOUS | Status: AC
  Administered 2016-06-12: 500 mL via INTRAVENOUS

## 2016-06-10 MED ORDER — SOD CITRATE-CITRIC ACID 500-334 MG/5ML PO SOLN
30.0000 mL | ORAL | Status: DC | PRN
  Administered 2016-06-11: 30 mL via ORAL
  Filled 2016-06-10: qty 15

## 2016-06-10 MED ORDER — OXYTOCIN 40 UNITS IN LACTATED RINGERS INFUSION - SIMPLE MED
1.0000 m[IU]/min | INTRAVENOUS | Status: DC
  Administered 2016-06-11: 1 m[IU]/min via INTRAVENOUS
  Filled 2016-06-10: qty 1000

## 2016-06-10 MED ORDER — PHENYLEPHRINE 40 MCG/ML (10ML) SYRINGE FOR IV PUSH (FOR BLOOD PRESSURE SUPPORT)
80.0000 ug | PREFILLED_SYRINGE | INTRAVENOUS | Status: DC | PRN
  Filled 2016-06-10: qty 5

## 2016-06-10 MED ORDER — OXYCODONE-ACETAMINOPHEN 5-325 MG PO TABS
2.0000 | ORAL_TABLET | ORAL | Status: DC | PRN
  Filled 2016-06-10: qty 2

## 2016-06-10 MED ORDER — LACTATED RINGERS IV SOLN
INTRAVENOUS | Status: DC
  Administered 2016-06-10 – 2016-06-11 (×4): via INTRAVENOUS

## 2016-06-10 MED ORDER — FLEET ENEMA 7-19 GM/118ML RE ENEM: 1.0000 | ENEMA | RECTAL | Status: DC | PRN

## 2016-06-10 MED ORDER — PHENYLEPHRINE 40 MCG/ML (10ML) SYRINGE FOR IV PUSH (FOR BLOOD PRESSURE SUPPORT)
PREFILLED_SYRINGE | INTRAVENOUS | Status: AC
  Filled 2016-06-10: qty 20

## 2016-06-10 MED ORDER — ACETAMINOPHEN 325 MG PO TABS
650.0000 mg | ORAL_TABLET | ORAL | Status: DC | PRN
  Administered 2016-06-11: 650 mg via ORAL
  Filled 2016-06-10: qty 2

## 2016-06-10 MED ORDER — OXYTOCIN 40 UNITS IN LACTATED RINGERS INFUSION - SIMPLE MED: 2.5000 [IU]/h | INTRAVENOUS | Status: DC

## 2016-06-10 MED ORDER — LIDOCAINE HCL (PF) 1 % IJ SOLN
INTRAMUSCULAR | Status: DC | PRN
  Administered 2016-06-10 (×2): 4 mL

## 2016-06-10 MED ORDER — DIPHENHYDRAMINE HCL 50 MG/ML IJ SOLN
12.5000 mg | INTRAMUSCULAR | Status: DC | PRN
  Administered 2016-06-11: 12.5 mg via INTRAVENOUS
  Filled 2016-06-10: qty 1

## 2016-06-10 MED ORDER — TERBUTALINE SULFATE 1 MG/ML IJ SOLN
0.2500 mg | Freq: Once | INTRAMUSCULAR | Status: DC | PRN
  Filled 2016-06-10: qty 1

## 2016-06-10 MED ORDER — PHENYLEPHRINE 40 MCG/ML (10ML) SYRINGE FOR IV PUSH (FOR BLOOD PRESSURE SUPPORT)
80.0000 ug | PREFILLED_SYRINGE | INTRAVENOUS | Status: DC | PRN
Start: 2016-06-10 — End: 2016-06-12
  Filled 2016-06-10: qty 5

## 2016-06-10 MED ORDER — ONDANSETRON HCL 4 MG/2ML IJ SOLN
4.0000 mg | Freq: Four times a day (QID) | INTRAMUSCULAR | Status: DC | PRN
  Administered 2016-06-11: 4 mg via INTRAVENOUS
  Filled 2016-06-10: qty 2

## 2016-06-10 MED ORDER — FENTANYL 2.5 MCG/ML BUPIVACAINE 1/10 % EPIDURAL INFUSION (WH - ANES)
14.0000 mL/h | INTRAMUSCULAR | Status: DC | PRN
  Administered 2016-06-10 – 2016-06-11 (×5): 14 mL/h via EPIDURAL
  Filled 2016-06-10 (×3): qty 125

## 2016-06-10 NOTE — Anesthesia Pain Management Evaluation Note (Signed)
  CRNA Pain Management Visit Note  Patient: Jennifer Randall, 25 y.o., female  "Hello I am a member of the anesthesia team at [facility]. We have an anesthesia team available at all times to provide care throughout the hospital, including epidural management and anesthesia for C-section. I don't know your plan for the delivery whether it a natural birth, water birth, IV sedation, nitrous supplementation, doula or epidural, but we want to meet your pain goals."   1.Was your pain managed to your expectations on prior hospitalizations?   No prior hospitalizations  2.What is your expectation for pain management during this hospitalization?     Epidural  3.How can we help you reach that goal? Epidural   Record the patient's initial score and the patient's pain goal.   Pain: 6  Pain Goal: 6 [facility] wants you to be able to say your pain was always managed very well.  Lynell Greenhouse 06/10/2016

## 2016-06-10 NOTE — MAU Note (Signed)
Pt states she has been contracting since 1000 this morning and they are now 2 minutes apart.  Pt states she lost her mucus plug but denies bleeding or leaking of fluid.

## 2016-06-10 NOTE — Anesthesia Procedure Notes (Signed)
Epidural Patient location during procedure: OB  Staffing Anesthesiologist: Lauretta Grill Performed: anesthesiologist   Preanesthetic Checklist Completed: patient identified, site marked, surgical consent, pre-op evaluation, timeout performed, IV checked, risks and benefits discussed and monitors and equipment checked  Epidural Patient position: sitting Prep: site prepped and draped and DuraPrep Patient monitoring: continuous pulse ox and blood pressure Approach: midline Location: L3-L4 Injection technique: LOR saline  Needle:  Needle type: Tuohy  Needle gauge: 17 G Needle length: 9 cm and 9 Needle insertion depth: 5 cm cm Catheter type: closed end flexible Catheter size: 19 Gauge Catheter at skin depth: 10 cm Test dose: negative  Assessment Events: blood not aspirated, injection not painful, no injection resistance, negative IV test and no paresthesia  Additional Notes Patient identified. Risks/Benefits/Options discussed with patient including but not limited to bleeding, infection, nerve damage, paralysis, failed block, incomplete pain control, headache, blood pressure changes, nausea, vomiting, reactions to medication both or allergic, itching and postpartum back pain. Confirmed with bedside nurse the patient's most recent platelet count. Confirmed with patient that they are not currently taking any anticoagulation, have any bleeding history or any family history of bleeding disorders. Patient expressed understanding and wished to proceed. All questions were answered. Sterile technique was used throughout the entire procedure. Please see nursing notes for vital signs. Test dose was given through epidural catheter and negative prior to continuing to dose epidural or start infusion. Warning signs of high block given to the patient including shortness of breath, tingling/numbness in hands, complete motor block, or any concerning symptoms with instructions to call for help. Patient was  given instructions on fall risk and not to get out of bed. All questions and concerns addressed with instructions to call with any issues or inadequate analgesia.

## 2016-06-10 NOTE — Anesthesia Preprocedure Evaluation (Signed)
Anesthesia Evaluation  Patient identified by MRN, date of birth, ID band Patient awake    Reviewed: Allergy & Precautions, NPO status , Patient's Chart, lab work & pertinent test results  Airway Mallampati: II  TM Distance: >3 FB Neck ROM: Full    Dental no notable dental hx. (+) Dental Advisory Given   Pulmonary neg pulmonary ROS,    Pulmonary exam normal breath sounds clear to auscultation       Cardiovascular negative cardio ROS Normal cardiovascular exam+ Valvular Problems/Murmurs (reports PACs)  Rhythm:Regular Rate:Normal     Neuro/Psych negative neurological ROS  negative psych ROS   GI/Hepatic negative GI ROS, Neg liver ROS,   Endo/Other  negative endocrine ROS  Renal/GU negative Renal ROS  negative genitourinary   Musculoskeletal negative musculoskeletal ROS (+)   Abdominal   Peds negative pediatric ROS (+)  Hematology negative hematology ROS (+)   Anesthesia Other Findings   Reproductive/Obstetrics (+) Pregnancy                             Anesthesia Physical Anesthesia Plan  ASA: II  Anesthesia Plan: Epidural   Post-op Pain Management:    Induction:   Airway Management Planned:   Additional Equipment:   Intra-op Plan:   Post-operative Plan:   Informed Consent: I have reviewed the patients History and Physical, chart, labs and discussed the procedure including the risks, benefits and alternatives for the proposed anesthesia with the patient or authorized representative who has indicated his/her understanding and acceptance.   Dental advisory given  Plan Discussed with: CRNA  Anesthesia Plan Comments:         Anesthesia Quick Evaluation

## 2016-06-10 NOTE — H&P (Signed)
Jennifer Randall is a 25 y.o. female presenting for labor  Jennifer Randall is a 25 yo G1P0 @ 40+3 who presents to MAU complaining of painful contractions. Upon presentation the patient presented to the MAU desk and was painfully contracting and felling pressure as if she needed to push.She was quickly placed in a room and was found rto be 1 cm dilated. In MAU she changed her cervix to 3cm and was admitted  OB History    Gravida Para Term Preterm AB Living   1             SAB TAB Ectopic Multiple Live Births                 Past Medical History:  Diagnosis Date  . Heart murmur    Past Surgical History:  Procedure Laterality Date  . TONSILLECTOMY    . TYMPANOSTOMY TUBE PLACEMENT     Family History: family history includes Cancer in her father; Hyperlipidemia in her maternal grandmother. Social History:  reports that she has never smoked. She has never used smokeless tobacco. She reports that she does not drink alcohol or use drugs.     Maternal Diabetes: No Genetic Screening: Declined Maternal Ultrasounds/Referrals: Normal Fetal Ultrasounds or other Referrals:  None Maternal Substance Abuse:  No Significant Maternal Medications:  None Significant Maternal Lab Results:  None Other Comments:  None  ROS History Dilation: 3 Effacement (%): 60 Station: -3 Exam by:: ARonnald Ramp RNC Blood pressure 114/79, pulse 85, temperature 97.9 F (36.6 C), temperature source Oral, resp. rate (!) 30, height 5\' 3"  (1.6 m), weight 77.6 kg (171 lb), last menstrual period 09/01/2015. Exam Physical Exam  Prenatal labs: ABO, Rh:  O+ Antibody:  Negative Rubella:  immune RPR:   NR HBsAg:   Neg HIV:   NR GBS:   Neg Assessment/Plan: 1) Admit 2) Epidural on request    Jennifer Randall H. 06/10/2016, 5:34 PM

## 2016-06-11 ENCOUNTER — Encounter (HOSPITAL_COMMUNITY): Payer: Self-pay | Admitting: *Deleted

## 2016-06-11 LAB — RPR: RPR: NONREACTIVE

## 2016-06-11 MED ORDER — SODIUM BICARBONATE 8.4 % IV SOLN
INTRAVENOUS | Status: DC | PRN
  Administered 2016-06-11: 6 mL via EPIDURAL

## 2016-06-11 MED ORDER — SODIUM CHLORIDE 0.9 % IV SOLN
2.0000 g | Freq: Four times a day (QID) | INTRAVENOUS | Status: DC
  Administered 2016-06-11: 2 g via INTRAVENOUS
  Filled 2016-06-11 (×3): qty 2000

## 2016-06-11 NOTE — Progress Notes (Signed)
Called for increased pain.  Patient reports that her abdomen is comfortable but increasing pain in tailbone and vagina.  Dosed with Lidocaine 2% with epinephrine  (6cc) at about 2145.  Checked on patient at 2215 and she is now comfortable.

## 2016-06-11 NOTE — Progress Notes (Signed)
Update Pt had been seen this am with normal and reassuring FHTS.   4/60/-2, AROM clear    FHTs 120s, gSTV, NST R; runs of earlies with occasional lates for 20-30 minutes then back to earlies. Conitnue unless lates persist.

## 2016-06-12 ENCOUNTER — Encounter (HOSPITAL_COMMUNITY): Payer: Self-pay

## 2016-06-12 MED ORDER — DIBUCAINE 1 % RE OINT: 1.0000 "application " | TOPICAL_OINTMENT | RECTAL | Status: DC | PRN

## 2016-06-12 MED ORDER — SODIUM CHLORIDE 0.9 % IV SOLN: 250.0000 mL | INTRAVENOUS | Status: DC | PRN

## 2016-06-12 MED ORDER — SODIUM CHLORIDE 0.9% FLUSH: 3.0000 mL | INTRAVENOUS | Status: DC | PRN

## 2016-06-12 MED ORDER — OXYCODONE-ACETAMINOPHEN 5-325 MG PO TABS
1.0000 | ORAL_TABLET | ORAL | Status: DC | PRN
  Filled 2016-06-12: qty 1

## 2016-06-12 MED ORDER — FERROUS SULFATE 325 (65 FE) MG PO TABS
325.0000 mg | ORAL_TABLET | Freq: Two times a day (BID) | ORAL | Status: DC
  Administered 2016-06-12 – 2016-06-13 (×4): 325 mg via ORAL
  Filled 2016-06-12 (×4): qty 1

## 2016-06-12 MED ORDER — ZOLPIDEM TARTRATE 5 MG PO TABS: 5.0000 mg | ORAL_TABLET | Freq: Every evening | ORAL | Status: DC | PRN

## 2016-06-12 MED ORDER — ONDANSETRON HCL 4 MG/2ML IJ SOLN: 4.0000 mg | INTRAMUSCULAR | Status: DC | PRN

## 2016-06-12 MED ORDER — SENNOSIDES-DOCUSATE SODIUM 8.6-50 MG PO TABS
2.0000 | ORAL_TABLET | ORAL | Status: DC
  Administered 2016-06-12 – 2016-06-14 (×2): 2 via ORAL
  Filled 2016-06-12 (×2): qty 2

## 2016-06-12 MED ORDER — METHYLERGONOVINE MALEATE 0.2 MG/ML IJ SOLN: 0.2000 mg | INTRAMUSCULAR | Status: DC | PRN

## 2016-06-12 MED ORDER — WITCH HAZEL-GLYCERIN EX PADS: 1.0000 "application " | MEDICATED_PAD | CUTANEOUS | Status: DC | PRN

## 2016-06-12 MED ORDER — ACETAMINOPHEN 325 MG PO TABS: 650.0000 mg | ORAL_TABLET | ORAL | Status: DC | PRN

## 2016-06-12 MED ORDER — ONDANSETRON HCL 4 MG PO TABS
4.0000 mg | ORAL_TABLET | ORAL | Status: DC | PRN
Start: 2016-06-12 — End: 2016-06-14

## 2016-06-12 MED ORDER — IBUPROFEN 800 MG PO TABS
800.0000 mg | ORAL_TABLET | Freq: Three times a day (TID) | ORAL | Status: DC
  Administered 2016-06-12 – 2016-06-14 (×7): 800 mg via ORAL
  Filled 2016-06-12 (×7): qty 1

## 2016-06-12 MED ORDER — TETANUS-DIPHTH-ACELL PERTUSSIS 5-2.5-18.5 LF-MCG/0.5 IM SUSP: 0.5000 mL | Freq: Once | INTRAMUSCULAR | Status: DC

## 2016-06-12 MED ORDER — COCONUT OIL OIL: 1.0000 "application " | TOPICAL_OIL | Status: DC | PRN

## 2016-06-12 MED ORDER — PRENATAL MULTIVITAMIN CH
1.0000 | ORAL_TABLET | Freq: Every day | ORAL | Status: DC
  Administered 2016-06-12 – 2016-06-13 (×2): 1 via ORAL
  Filled 2016-06-12 (×2): qty 1

## 2016-06-12 MED ORDER — DIPHENHYDRAMINE HCL 25 MG PO CAPS: 25.0000 mg | ORAL_CAPSULE | Freq: Four times a day (QID) | ORAL | Status: DC | PRN

## 2016-06-12 MED ORDER — MAGNESIUM HYDROXIDE 400 MG/5ML PO SUSP: 30.0000 mL | ORAL | Status: DC | PRN

## 2016-06-12 MED ORDER — SIMETHICONE 80 MG PO CHEW: 80.0000 mg | CHEWABLE_TABLET | ORAL | Status: DC | PRN

## 2016-06-12 MED ORDER — OXYCODONE-ACETAMINOPHEN 5-325 MG PO TABS
2.0000 | ORAL_TABLET | ORAL | Status: DC | PRN
  Administered 2016-06-12: 2 via ORAL

## 2016-06-12 MED ORDER — SODIUM CHLORIDE 0.9% FLUSH: 3.0000 mL | Freq: Two times a day (BID) | INTRAVENOUS | Status: DC

## 2016-06-12 MED ORDER — METHYLERGONOVINE MALEATE 0.2 MG PO TABS: 0.2000 mg | ORAL_TABLET | ORAL | Status: DC | PRN

## 2016-06-12 MED ORDER — MEASLES, MUMPS & RUBELLA VAC ~~LOC~~ INJ
0.5000 mL | INJECTION | Freq: Once | SUBCUTANEOUS | Status: DC
  Filled 2016-06-12: qty 0.5

## 2016-06-12 MED ORDER — BENZOCAINE-MENTHOL 20-0.5 % EX AERO
1.0000 "application " | INHALATION_SPRAY | CUTANEOUS | Status: DC | PRN
  Filled 2016-06-12 (×2): qty 56

## 2016-06-12 NOTE — Anesthesia Postprocedure Evaluation (Signed)
Anesthesia Post Note  Patient: Jennifer Randall  Procedure(s) Performed: * No procedures listed *  Patient location during evaluation: Mother Baby Anesthesia Type: Epidural Level of consciousness: awake Pain management: pain level controlled Vital Signs Assessment: post-procedure vital signs reviewed and stable Respiratory status: spontaneous breathing Cardiovascular status: stable Postop Assessment: no headache, no backache, epidural receding and patient able to bend at knees Anesthetic complications: no     Last Vitals:  Vitals:   06/12/16 0425 06/12/16 0530  BP: 120/71 (!) 113/56  Pulse: 88 100  Resp: 20 18  Temp: 36.9 C 37 C    Last Pain:  Vitals:   06/12/16 0530  TempSrc: Axillary  PainSc: 3    Pain Goal:                 Everette Rank

## 2016-06-12 NOTE — Lactation Note (Signed)
This note was copied from a baby's chart. Lactation Consultation Note  Patient Name: Jennifer Randall [id number] Date: 06/12/2016 Reason for consult: Follow-up assessment Mom called for assist with latch. Baby sleepy/spitty but would suckle on LC finger. Jaw massage and suck training demonstrated due to baby tongue thrusting. Mom's nipples very short shaft bilateral, almost flat, some aerola edema present. Using breast compression baby would take few suckles but could not sustain a latch. After several attempts, initiated #20 nipple shield but changed to #16 for better fit. Baby was able to latch and sustain the latch, scant amount of colostrum (1-2 drops) on Mom's nipple with nursing.  Gave Mom breast shells to wear and demonstrated using hand pump to pre-pump to help with latch. Encouraged to pre-pump before offering breast. Offer breast without the nipple shield but if baby could not latch then use #16 nipple shield. Encouraged to continue to BF with feeding ques, basic teaching reviewed. Encouraged to call for assist with next feeding till baby latching consistently. Advised RN if Mom continues to need nipple shield to please set up  DEBP for Mom to use.   Maternal Data Has patient been taught Hand Expression?: Yes Does the patient have breastfeeding experience prior to this delivery?: No  Feeding Feeding Type: Breast Fed Length of feed: 15 min  LATCH Score/Interventions Latch: Repeated attempts needed to sustain latch, nipple held in mouth throughout feeding, stimulation needed to elicit sucking reflex. (initiated #16 nipple shield) Intervention(s): Adjust position;Assist with latch;Breast massage;Breast compression  Audible Swallowing: None  Type of Nipple: Flat Intervention(s): Shells;Hand pump  Comfort (Breast/Nipple): Soft / non-tender     Hold (Positioning): Assistance needed to correctly position infant at breast and maintain latch. Intervention(s): Breastfeeding basics  reviewed;Support Pillows;Position options;Skin to skin  LATCH Score: 5  Lactation Tools Discussed/Used Tools: Nipple Jefferson Fuel;Shells;Pump Nipple shield size: 16;20 Shell Type: Inverted Breast pump type: Manual WIC Program: No   Consult Status Consult Status: Follow-up Date: 06/13/16 Follow-up type: In-patient    Katrine Coho 06/12/2016, 5:36 PM

## 2016-06-12 NOTE — Lactation Note (Signed)
This note was copied from a baby's chart. Lactation Consultation Note  Patient Name: Jennifer Randall [id number] Date: 06/12/2016 Reason for consult: Initial assessment RN recently assisted Mom with BF and reports baby not wanting to suckle so she demonstrated finger feeding 3 ml of EBM to baby. LC advised Mom to call with next feeding for assist. Encouraged to BF with feeding ques. Mom has company at present so did not try to latch. Lactation brochure left for review, advised of OP services and support group. [place] left phone number to call for assist.   Maternal Data Does the patient have breastfeeding experience prior to this delivery?: No  Feeding Feeding Type: Breast Fed Length of feed: 20 min  LATCH Score/Interventions                      Lactation Tools Discussed/Used WIC Program: No   Consult Status Consult Status: Follow-up Date: 06/12/16 Follow-up type: In-patient    Katrine Coho 06/12/2016, 2:15 PM

## 2016-06-12 NOTE — Progress Notes (Addendum)
Patient is eating, ambulating, voiding.  Pain control is good. Appropriate lochia, no complaints.  Vitals:   06/12/16 0425 06/12/16 0530 06/12/16 1010 06/12/16 1710  BP: 120/71 (!) 113/56 (!) 108/58 110/62  Pulse: 88 100 91 92  Resp: 20 18 18 18   Temp: 98.5 F (36.9 C) 98.6 F (37 C) 98.7 F (37.1 C) 97.9 F (36.6 C)  TempSrc: Axillary Axillary Oral Oral  SpO2:   97% 98%  Weight:      Height:        Fundus firm Perineum without swelling.  Lab Results  Component Value Date   WBC 11.1 (H) 06/10/2016   HGB 12.0 06/10/2016   HCT 35.3 (L) 06/10/2016   MCV 87.8 06/10/2016   PLT 127 (L) 06/10/2016    --/--/O POS (09/03 1730)  A/P Post partum day 0. Circ desired, baby deliver in am, not cleared by peds yet  Routine care.  Expect d/c 9/6 .    Allyn Kenner

## 2016-06-13 LAB — CBC
HEMATOCRIT: 24.8 % — AB (ref 36.0–46.0)
HEMOGLOBIN: 8.6 g/dL — AB (ref 12.0–15.0)
MCH: 29.9 pg (ref 26.0–34.0)
MCHC: 34.7 g/dL (ref 30.0–36.0)
MCV: 86.1 fL (ref 78.0–100.0)
Platelets: 192 10*3/uL (ref 150–400)
RBC: 2.88 MIL/uL — AB (ref 3.87–5.11)
RDW: 13.9 % (ref 11.5–15.5)
WBC: 15.2 10*3/uL — ABNORMAL HIGH (ref 4.0–10.5)

## 2016-06-13 NOTE — Progress Notes (Signed)
PPD#1 Pt has no c/o. Discussed circ with pt and husband. Pt states that baby is not feeding well. VSSAF Hgb-8.6 IMP/stable Plan/ Routine care          Circ done

## 2016-06-13 NOTE — Lactation Note (Signed)
This note was copied from a baby's chart. Lactation Consultation Note  Patient Name: Boy Sophee Kocourek [id number] Date: 06/13/2016 Reason for consult: Follow-up assessment;Difficult latch Baby had circumcision today and sleepy. Mom is having difficulty getting baby to sustain latch without the nipple shield and some difficulty using nipple shield. At this visit, had Mom pre-pump with hand pump and assisted Mom with trying to latch baby without the nipple shield. Baby became very frustrated, unable to obtain latch. Applied #16 nipple shield and assisted Mom with latch. Took baby few minutes to develop a good suckling rhythm, deep dimpling noted with BF. Changed nipple shield to size 20, baby latched well but dimpling continued throughout the feeding. Colostrum present in the nipple shield with this feeding. LC does note short posterior lingual frenulum which may be restricting tongue mobility resulting in dimpling and working harder to sustain pressure needed to BF and transfer milk. Baby fell asleep after nursing for 13 minutes. LC assisted Mom to post pump with DEBP and encouraged Mom to post pump for 15 minutes followed by 5 minutes of hand expression every 3 hours to encourage milk production and if she receives any colostrum advised to give this back to baby. Advised Mom baby should be at breast 8-12 times in 24 hours and with feeding ques, nursing for 15-20 minutes both breasts some feedings. Encouraged Mom to call as needed for assist with latch. Encouraged OP f/u if going home using nipple shield.   Maternal Data    Feeding Feeding Type: Breast Fed Length of feed: 13 min  LATCH Score/Interventions Latch: Repeated attempts needed to sustain latch, nipple held in mouth throughout feeding, stimulation needed to elicit sucking reflex. (#20 nipple shield) Intervention(s): Adjust position;Assist with latch;Breast massage;Breast compression  Audible Swallowing: A few with stimulation  Type of  Nipple: Everted at rest and after stimulation (short nipple shafts bilateral) Intervention(s): Shells;Double electric pump  Comfort (Breast/Nipple): Soft / non-tender     Hold (Positioning): Assistance needed to correctly position infant at breast and maintain latch. Intervention(s): Breastfeeding basics reviewed;Support Pillows;Position options;Skin to skin  LATCH Score: 7  Lactation Tools Discussed/Used Tools: Shells;Nipple Jefferson Fuel;Pump Nipple shield size: 20;16 Shell Type: Inverted Breast pump type: Double-Electric Breast Pump   Consult Status Consult Status: Follow-up Date: 06/14/16 Follow-up type: In-patient    Katrine Coho 06/13/2016, 2:40 PM

## 2016-06-13 NOTE — Progress Notes (Signed)
LCSW is aware of consult. Attempted twice this morning and [name] was in the shower and having issues with water. Attempted later this afternoon and visitors were in the room. Will re-attempt in the morning and follow up for consult.  Discussed with RN barriers and made aware that LCSW will attempt to see. NO current stressors noted by nursing.   Lane Hacker, MSW Clinical Social Work: System Print production planner for [employer] social worker [phone]

## 2016-06-14 NOTE — Progress Notes (Signed)
Patient is eating, ambulating, voiding.  Pain control is good.  Vitals:   06/12/16 1710 06/13/16 0617 06/13/16 1751 06/14/16 0510  BP: 110/62 (!) 108/57 109/63 107/69  Pulse: 92 73 98 72  Resp: 18 18 18 18   Temp: 97.9 F (36.6 C) 97.8 F (36.6 C) 98.8 F (37.1 C) 98 F (36.7 C)  TempSrc: Oral  Oral Oral  SpO2: 98%     Weight:      Height:        Fundus firm Perineum without swelling.  Lab Results  Component Value Date   WBC 15.2 (H) 06/13/2016   HGB 8.6 (L) 06/13/2016   HCT 24.8 (L) 06/13/2016   MCV 86.1 06/13/2016   PLT 192 06/13/2016    --/--/O POS (09/03 1730)/RI  A/P Post partum day 2.  Routine care.  Expect d/c today.  [place].  Danyale Ridinger A

## 2016-06-14 NOTE — Clinical Social Work Maternal (Signed)
  CLINICAL SOCIAL WORK MATERNAL/CHILD NOTE  Patient Details  Name: Jennifer Randall MRN: [id number] Date of Birth: 09/13/1991  Date:  06/14/2016  Clinical Social Worker Initiating Note:  Jennifer Cove, LCSW Date/ Time Initiated:  06/14/16/0926     Child's Name:  Jennifer Randall   Legal Guardian:  Mother   Need for Interpreter:  None   Date of Referral:  06/14/16     Reason for Referral:  Other (Comment) (hx of anxiety)   Referral Source:  RN   Address:     Phone number:      Household Members:  Spouse   Natural Supports (not living in the home):  Friends, Extended Family, Immediate Family   Professional Supports: None   Employment: Full-time   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  Cultural/Religious Considerations Which May Impact Care:  none reported  Strengths:  Ability to meet basic needs , Compliance with medical plan , Home prepared for child , Pediatrician chosen  ([facility])   Risk Factors/Current Problems:  Adjustment to Illness , Mental Health Concerns    Cognitive State:  Alert , Linear Thinking , Insightful    Mood/Affect:  Calm , Comfortable    CSW Assessment: LCSW received consult regarding hx of depression/anxiety and panic attacks.  [name] in room with FOB who was soundly sleeping. [name] bonding with baby in bed doing skin to skin. She reports she is tired, but adjusting to parenthood.  LCSW explained role and reason for consult in which she was agreeable to complete assessment.  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  She was on medication (Zoloft) in which she reports was helpful, but  was not needed during pregnancy or at this time. She denies any panic attacks or feelings of depression.  LCSW reviewed education material regarding PPD , check list, and resources for assistance if needed. [name] was receptive and appreciative. Information left at the bedside.  She reports positive support from her family and spouse and friends. 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 She has chosen her Pediatrician and will follow up with OB.  She has all necessary means for baby in the home and excited to hopefully be discharged today.  No other needs warranted and no formal referrals needed at this time or requested.  CSW Plan/Description:  Information/Referral to [employer] , [employer] , No Further Intervention Required/No Barriers to Discharge    Jennifer Randall 06/14/2016, 9:33 AM

## 2016-06-14 NOTE — Lactation Note (Signed)
This note was copied from a baby's chart. Lactation Consultation Note Mom BF when enter the rm. In cradle position wearing #20 NS. Mom states comfortable. Baby satisfied when stopped BF. Mom has transitional milk in NS. Mom excited. Doing well. Discussed body posture while feeding for baby and mom.   Patient Name: Jennifer Randall [id number] Date: 06/14/2016 Reason for consult: Follow-up assessment   Maternal Data    Feeding Feeding Type: Breast Fed Length of feed: 25 min  LATCH Score/Interventions Latch: Grasps breast easily, tongue down, lips flanged, rhythmical sucking.  Audible Swallowing: Spontaneous and intermittent Intervention(s): Hand expression;Skin to skin  Type of Nipple: Everted at rest and after stimulation  Comfort (Breast/Nipple): Soft / non-tender     Hold (Positioning): No assistance needed to correctly position infant at breast. Intervention(s): Breastfeeding basics reviewed;Support Pillows;Position options;Skin to skin  LATCH Score: 10  Lactation Tools Discussed/Used Nipple shield size: 20 Shell Type: Inverted   Consult Status Consult Status: Follow-up Date: 06/14/16 Follow-up type: In-patient    Theodoro Kalata 06/14/2016, 4:58 AM

## 2016-06-14 NOTE — Lactation Note (Signed)
This note was copied from a baby's chart. Lactation Consultation Note  Patient Name: Jennifer Randall [id number] Date: 06/14/2016 Reason for consult: Follow-up assessment;Other (Comment);Infant weight loss (8% weight loss, elevated Bili )  Baby is 56 hours. And per mom cluster fed all night with #20 NS and milk noted in the NS and lots swallows with feeding,   breast are fuller, heavier and per mom NS not fitting as well as it was. LC had mom demo applying the NS. LC felt there could be a better fit  Through application. [name] demo application for mom and mom repeated and achieved a better fit. Sore ni[ple and engorgement prevention and tx reviewed.  Mom denies soreness , just getting heavier and fuller.  LC explained the process of milk coming in - Full ( heavier, warm ,sensistive tissue  = good sign m fatty milk transitioning in) , if breast are greater than full  Starting to feeling tight , wake baby up and offer breast , or release down to comfort. Breast heading towards firm - ice for 15 -20 mins , try lying down while  Icing, and after ice , massage , hand express, pre- pump if needed when milk comes in and latch. Always softened 1st breast before offering the 2nd breast.  LC discussed jaundice and the potential for the baby being sluggish and importance of feeding skin to skin until the baby can stay awake for a feeding.  Also nutritive vs non - nutritive feeding patterns.  LC recommended coming in for a LC O/P appt and mom and dad consented. LC O/P F/U Tuesday 9/12 at 1pm , Appt, reminder given to mom.  Mom voiced concerns weather insurance would pay for [facility] O/P/. [employer] recommended  [employer]. LC mentioned there are reasons for close  LC F/U , Jaundice , use of NS due to difficult latch, need for extra pumping, and 8% weight loss.  Mother informed of post-discharge support and given phone number to the lactation department, including services for phone call assistance; out-patient   appointments; and breastfeeding support group. List of other breastfeeding resources in the community given in the handout. Encouraged mother to call for  problems or concerns related to breastfeeding.     Maternal Data    Feeding Feeding Type: Breast Fed Length of feed: 8 min (per mom milk in the NS after feeding ending )  LATCH Score/Interventions Latch: Grasps breast easily, tongue down, lips flanged, rhythmical sucking. Intervention(s): Adjust position;Assist with latch;Breast massage;Breast compression  Audible Swallowing: None  Type of Nipple: Everted at rest and after stimulation (#20 NS )  Comfort (Breast/Nipple): Filling, red/small blisters or bruises, mild/mod discomfort  Problem noted: Severe discomfort;Filling  Hold (Positioning): Assistance needed to correctly position infant at breast and maintain latch. Intervention(s): Breastfeeding basics reviewed;Support Pillows;Position options;Skin to skin  LATCH Score: 6  Lactation Tools Discussed/Used Nipple shield size: 20 Shell Type: Inverted   Consult Status Consult Status: Follow-up Follow-up type: Out-patient    Myer Haff 06/14/2016, 10:31 AM

## 2016-06-14 NOTE — Discharge Summary (Signed)
Obstetric Discharge Summary Reason for Admission: onset of labor Prenatal Procedures: none Intrapartum Procedures: spontaneous vaginal delivery Postpartum Procedures: none Complications-Operative and Postpartum: 2 degree perineal laceration Hemoglobin  Date Value Ref Range Status  06/13/2016 8.6 (L) 12.0 - 15.0 g/dL Final   HCT  Date Value Ref Range Status  06/13/2016 24.8 (L) 36.0 - 46.0 % Final    Discharge Diagnoses: Term Pregnancy-delivered  Discharge Information: Date: 06/14/2016 Activity: pelvic rest Diet: routine Medications: Ibuprofen and Iron Condition: stable Instructions: refer to practice specific booklet Discharge to: home Follow-up Information    Jermond Burkemper A, MD Follow up in 4 week(s).   Specialty:  Obstetrics and Gynecology Contact information: [place] [address] [phone]           Newborn Data: Live born female  Birth Weight: 8 lb 6.8 oz (3820 g) APGAR: 7, 9  Home with mother.  Jcion Buddenhagen A 06/14/2016, 7:53 AM

## 2016-06-15 ENCOUNTER — Telehealth (HOSPITAL_COMMUNITY): Payer: Self-pay | Admitting: Lactation Services

## 2016-06-15 NOTE — Telephone Encounter (Signed)
Mom had requested [facility] phone call when secretary was making customer satisfaction calls. Mom reports that she had been using a nipple shield while in the hospital, but once her milk came to volume, the nipple shield began "popping off". Infant did latch once to the bare breast, but did not continue to do so. Mom has now been bottle-feeding EBM. Mom concerned about infant not returning to breast. I recommended that Mom wet the nipple shield to help it adhere to the breast better. Mom will also prefill the NS w/EBM so that baby will have the ready flow. Mom encouraged to sit baby up w/bottle parallel to floor when giving a bottle. Our phone call cut was short b/c baby began to cry. Mom knows she can call back with further questions. Mom has an [place] appt on Tuesday.  Elinor Dodge, RN, IBCLC

## 2016-06-19 ENCOUNTER — Ambulatory Visit (HOSPITAL_COMMUNITY): Admit: 2016-06-19 | Payer: BC Managed Care – PPO

## 2016-07-17 LAB — HM PAP SMEAR

## 2017-01-26 ENCOUNTER — Encounter (HOSPITAL_COMMUNITY): Payer: Self-pay | Admitting: Emergency Medicine

## 2017-01-26 ENCOUNTER — Ambulatory Visit (HOSPITAL_COMMUNITY)
Admission: EM | Admit: 2017-01-26 | Discharge: 2017-01-26 | Disposition: A | Discharge status: Home / Self Care | Payer: BC Managed Care – PPO | Attending: Family Medicine | Admitting: Family Medicine

## 2017-01-26 DIAGNOSIS — Z3202 Encounter for pregnancy test, result negative: Secondary | ICD-10-CM | POA: Diagnosis not present

## 2017-01-26 DIAGNOSIS — R350 Frequency of micturition: Secondary | ICD-10-CM | POA: Diagnosis not present

## 2017-01-26 DIAGNOSIS — R300 Dysuria: Secondary | ICD-10-CM | POA: Diagnosis not present

## 2017-01-26 DIAGNOSIS — R319 Hematuria, unspecified: Secondary | ICD-10-CM | POA: Insufficient documentation

## 2017-01-26 DIAGNOSIS — N3001 Acute cystitis with hematuria: Secondary | ICD-10-CM

## 2017-01-26 LAB — POCT URINALYSIS DIP (DEVICE)
Bilirubin Urine: NEGATIVE
Glucose, UA: NEGATIVE mg/dL
Ketones, ur: NEGATIVE mg/dL
NITRITE: NEGATIVE
PH: 6 (ref 5.0–8.0)
PROTEIN: NEGATIVE mg/dL
Specific Gravity, Urine: >=1.03 (ref 1.005–1.030)
UROBILINOGEN UA: 0.2 mg/dL (ref 0.0–1.0)

## 2017-01-26 LAB — POCT PREGNANCY, URINE: PREG TEST UR: NEGATIVE

## 2017-01-26 MED ORDER — NITROFURANTOIN MONOHYD MACRO 100 MG PO CAPS: 100.0000 mg | ORAL_CAPSULE | Freq: Two times a day (BID) | ORAL | 0 refills | Status: DC

## 2017-01-26 NOTE — ED Triage Notes (Signed)
Pt reports blood after she urinates, frequency of urination, pain with urination and lower back pain since yesterday.  She denies any fever.

## 2017-01-26 NOTE — ED Provider Notes (Signed)
CSN: [id number]     Arrival date & time 01/26/17  1230 History   First MD Initiated Contact with Patient 01/26/17 1413     Chief Complaint  Patient presents with  . Hematuria   (Consider location/radiation/quality/duration/timing/severity/associated sxs/prior Treatment) The history is provided by the patient.  Dysuria  Pain quality:  Burning Pain severity:  Mild Onset quality:  Sudden Duration:  2 days Timing:  Constant Progression:  Unchanged Chronicity:  New Recent urinary tract infections: no   Urinary symptoms: discolored urine, foul-smelling urine, frequent urination, hematuria and hesitancy   Associated symptoms: abdominal pain   Associated symptoms: no fever, no flank pain, no nausea, no vaginal discharge and no vomiting   Risk factors: sexually active     Past Medical History:  Diagnosis Date  . Heart murmur    Past Surgical History:  Procedure Laterality Date  . TONSILLECTOMY    . TYMPANOSTOMY TUBE PLACEMENT     Family History  Problem Relation Age of Onset  . Cancer Father   . Hyperlipidemia Maternal Grandmother    Social History  Substance Use Topics  . Smoking status: Never Smoker  . Smokeless tobacco: Never Used  . Alcohol use No   OB History    Gravida Para Term Preterm AB Living   1 1 1  0 0 1   SAB TAB Ectopic Multiple Live Births   0 0 0 0 1     Review of Systems  Constitutional: Negative for chills and fever.  Gastrointestinal: Positive for abdominal pain. Negative for nausea and vomiting.  Genitourinary: Positive for difficulty urinating, dysuria and frequency. Negative for flank pain and vaginal discharge.    Allergies  Sulfa antibiotics  Home Medications   Prior to Admission medications   Medication Sig Start Date End Date Taking? Authorizing Provider  nitrofurantoin, macrocrystal-monohydrate, (MACROBID) 100 MG capsule Take 1 capsule (100 mg total) by mouth 2 (two) times daily. 01/26/17   Barry Dienes, NP  Prenatal Vit-Fe Fumarate-FA  (MULTIVITAMIN-PRENATAL) 27-0.8 MG TABS tablet Take 1 tablet by mouth daily at 12 noon.     Historical Provider, MD   Meds Ordered and Administered this Visit  Medications - No data to display  BP 114/65 (BP Location: Left Arm)   Pulse 75   Temp 98.5 F (36.9 C) (Oral)   Resp 12   LMP 01/15/2017 (Exact Date)   SpO2 100%  No data found.   Physical Exam  Constitutional: She is oriented to person, place, and time. She appears well-developed and well-nourished.  HENT:  Head: Normocephalic and atraumatic.  Cardiovascular: Normal rate, regular rhythm and normal heart sounds.   Pulmonary/Chest: Effort normal and breath sounds normal.  Abdominal: Soft. Bowel sounds are normal. She exhibits no distension. There is no tenderness.  Genitourinary:  Genitourinary Comments: Positive left CVA tenderness  Neurological: She is alert and oriented to person, place, and time.  Skin: Skin is warm and dry.  Nursing note and vitals reviewed.   Urgent Care Course     Procedures (including critical care time)  Labs Review Labs Reviewed  POCT URINALYSIS DIP (DEVICE) - Abnormal; Notable for the following:       Result Value   Hgb urine dipstick TRACE (*)    Leukocytes, UA SMALL (*)    All other components within normal limits  URINE CULTURE  POCT PREGNANCY, URINE    Imaging Review No results found.  MDM   1. Acute cystitis with hematuria    UA has trace hemoglobin  and small leukocytes. Urine culture pending. Symptoms indicatives of UTI. Will tx with Macrobid BID x 5 days. Follow up with PCP for no improvement.     Barry Dienes, NP 01/26/17 1424

## 2017-01-28 ENCOUNTER — Ambulatory Visit: Payer: BC Managed Care – PPO

## 2017-01-30 LAB — URINE CULTURE

## 2017-02-28 ENCOUNTER — Ambulatory Visit: Payer: BC Managed Care – PPO | Admitting: Physician Assistant

## 2018-01-15 ENCOUNTER — Encounter: Payer: Self-pay | Admitting: Physician Assistant

## 2018-08-15 LAB — OB RESULTS CONSOLE GC/CHLAMYDIA
Chlamydia: NEGATIVE
Gonorrhea: NEGATIVE

## 2018-08-15 LAB — OB RESULTS CONSOLE RUBELLA ANTIBODY, IGM: Rubella: IMMUNE

## 2018-08-15 LAB — OB RESULTS CONSOLE HEPATITIS B SURFACE ANTIGEN: Hepatitis B Surface Ag: NEGATIVE

## 2018-08-15 LAB — OB RESULTS CONSOLE HIV ANTIBODY (ROUTINE TESTING): HIV: NONREACTIVE

## 2018-10-08 NOTE — L&D Delivery Note (Signed)
Patient was C/C/+1 and pushed for approximately 20 minutes without epidural.    NSVD female infant, Apgars pending, weight pending.   The patient had 1st degree laceration repaied with 3-0 vicryl. Fundus was firm. EBL was expected amount. Placenta was delivered intact. Vagina was clear.  Delayed cord clamping done for 30-60 seconds while warming baby. Baby was vigorous and doing skin to skin with mother, then brought to warmer to suction, baby pink and vigorous.  Allyn Kenner

## 2018-10-13 ENCOUNTER — Encounter (HOSPITAL_COMMUNITY): Payer: Self-pay

## 2018-10-13 ENCOUNTER — Other Ambulatory Visit: Payer: Self-pay

## 2018-10-13 ENCOUNTER — Ambulatory Visit (HOSPITAL_COMMUNITY)
Admission: EM | Admit: 2018-10-13 | Discharge: 2018-10-13 | Disposition: A | Discharge status: Home / Self Care | Payer: BC Managed Care – PPO | Attending: Internal Medicine | Admitting: Internal Medicine

## 2018-10-13 DIAGNOSIS — R69 Illness, unspecified: Secondary | ICD-10-CM | POA: Diagnosis not present

## 2018-10-13 DIAGNOSIS — J111 Influenza due to unidentified influenza virus with other respiratory manifestations: Secondary | ICD-10-CM

## 2018-10-13 MED ORDER — OSELTAMIVIR PHOSPHATE 75 MG PO CAPS: 75.0000 mg | ORAL_CAPSULE | Freq: Two times a day (BID) | ORAL | 0 refills | Status: AC

## 2018-10-13 MED ORDER — DOXYLAMINE-PYRIDOXINE 10-10 MG PO TBEC: 10.0000 mg | DELAYED_RELEASE_TABLET | Freq: Two times a day (BID) | ORAL | 0 refills | Status: DC

## 2018-10-13 NOTE — ED Triage Notes (Signed)
Pt cc fever , vomiting, coughing and headaches x 3 days. Pt states she has sinus pressure as well. Pt is pregnant [redacted] weeks.

## 2018-10-13 NOTE — Discharge Instructions (Addendum)
Symptoms concerning for flu You may begin tamiflu twice daily for 5 days Please reference list of medicines safe in pregnancy that you may try to further help with congestion/cough Drink plenty of fluids Rest Viral illnesses usually resolve over 1-2 weeks  Diclegis: Two tablets at bedtime on day 1 and 2; if symptoms persist, take 1 tablet in morning and 2 tablets at bedtime on day 3; if symptoms persist, may increase to 1 tablet in morning, 1 tablet mid-afternoon, and 2 tablets at bedtime on day 4 (maximum: doxylamine 40 mg/pyridoxine 40 mg (4 tablets) per day). OR One-half of the 25 mg Unisom sleep tablet over-the-counter tablet or two chewable 5 mg tablets can be used off-label as an antiemetic. In addition, pyridoxine 25 mg, also available over-the-counter, is taken three or four times per day;This is a reasonable, less expensive substitute for combination tablets.  Follow up if fever persisting, developing worsening chest discomfort, shortness of breath, difficulty breathing, persistent symptoms

## 2018-10-13 NOTE — ED Provider Notes (Signed)
[place]    CSN: [id number] Arrival date & time: 10/13/18  1227     History   Chief Complaint Chief Complaint  Patient presents with  . Appointment    1230  . Fever    HPI Jennifer Randall is a 28 y.o. female approximately [redacted] weeks pregnant presenting today for evaluation of fever, vomiting and cough.  Patient states since Saturday over the past 2 days she has felt well.  Symptoms have included sinus pressure, headache.  She has noticed some blood-tinged sputum.  She noted a fever of 101.8 yesterday.  She also had a couple episodes of vomiting yesterday.  This is not persisted and she has been able to tolerate water, soup and a bagel since.  She initially had a sore throat and burning sensation in her chest but this is resolved as well.  She continues to have headache, sinus pressure, hot and cold chills and body aches.  She has been taking Tylenol and ibuprofen sparingly as recommended by her OB/GYN.  HPI  Past Medical History:  Diagnosis Date  . Heart murmur     Patient Active Problem List   Diagnosis Date Noted  . Postpartum state 06/12/2016  . Indication for care or intervention related to labor and delivery 06/10/2016    Past Surgical History:  Procedure Laterality Date  . TONSILLECTOMY    . TYMPANOSTOMY TUBE PLACEMENT      OB History    Gravida  2   Para  1   Term  1   Preterm  0   AB  0   Living  1     SAB  0   TAB  0   Ectopic  0   Multiple  0   Live Births  1            Home Medications    Prior to Admission medications   Medication Sig Start Date End Date Taking? Authorizing Provider  Doxylamine-Pyridoxine 10-10 MG TBEC Take 10 mg by mouth 2 (two) times daily. 10/13/18   Serenah Mill C, PA-C  nitrofurantoin, macrocrystal-monohydrate, (MACROBID) 100 MG capsule Take 1 capsule (100 mg total) by mouth 2 (two) times daily. 01/26/17   Barry Dienes, NP  oseltamivir (TAMIFLU) 75 MG capsule Take 1 capsule (75 mg total) by mouth  every 12 (twelve) hours for 5 days. 10/13/18 10/18/18  Lofton Leon C, PA-C  Prenatal Vit-Fe Fumarate-FA (MULTIVITAMIN-PRENATAL) 27-0.8 MG TABS tablet Take 1 tablet by mouth daily at 12 noon.     [provider]    Family History Family History  Problem Relation Age of Onset  . Cancer Father   . Hyperlipidemia Maternal Grandmother     Social History Social History   Tobacco Use  . Smoking status: Never Smoker  . Smokeless tobacco: Never Used  Substance Use Topics  . Alcohol use: No    Alcohol/week: 0.0 standard drinks  . Drug use: No     Allergies   Sulfa antibiotics   Review of Systems Review of Systems  Constitutional: Positive for chills, fatigue and fever. Negative for activity change and appetite change.  HENT: Positive for congestion, rhinorrhea, sinus pressure and sore throat. Negative for ear pain and trouble swallowing.   Eyes: Negative for discharge and redness.  Respiratory: Positive for cough. Negative for chest tightness and shortness of breath.   Cardiovascular: Negative for chest pain.  Gastrointestinal: Positive for vomiting. Negative for abdominal pain, diarrhea and nausea.  Musculoskeletal: Positive for  myalgias.  Skin: Negative for rash.  Neurological: Negative for dizziness, light-headedness and headaches.     Physical Exam Triage Vital Signs ED Triage Vitals  Enc Vitals Group     BP 10/13/18 1252 92/62     Pulse --      Resp 10/13/18 1252 18     Temp 10/13/18 1252 98.3 F (36.8 C)     Temp Source 10/13/18 1252 Oral     SpO2 10/13/18 1252 100 %     Weight 10/13/18 1250 128 lb (58.1 kg)     Height --      Head Circumference --      Peak Flow --      Pain Score 10/13/18 1250 2     Pain Loc --      Pain Edu? --      Excl. in [place]? --    No data found.  Updated Vital Signs BP 92/62 (BP Location: Right Arm)   Temp 98.3 F (36.8 C) (Oral)   Resp 18   Wt 128 lb (58.1 kg)   SpO2 100%   BMI 22.67 kg/m   Visual Acuity Right  Eye Distance:   Left Eye Distance:   Bilateral Distance:    Right Eye Near:   Left Eye Near:    Bilateral Near:     Physical Exam Vitals signs and nursing note reviewed.  Constitutional:      General: She is not in acute distress.    Appearance: She is well-developed.  HENT:     Head: Normocephalic and atraumatic.     Ears:     Comments: Bilateral ears without tenderness to palpation of external auricle, tragus and mastoid, EAC's without erythema or swelling, TM's with good bony landmarks and cone of light. Non erythematous.    Nose:     Comments: Nasal mucosa erythematous, swollen turbinates bilaterally    Mouth/Throat:     Comments: Oral mucosa pink and moist, left tonsil present, right tonsil absent.. Posterior pharynx patent and nonerythematous, no uvula deviation or swelling. Normal phonation. Eyes:     Conjunctiva/sclera: Conjunctivae normal.  Neck:     Musculoskeletal: Neck supple.  Cardiovascular:     Rate and Rhythm: Normal rate and regular rhythm.     Heart sounds: No murmur.  Pulmonary:     Effort: Pulmonary effort is normal. No respiratory distress.     Breath sounds: Normal breath sounds.     Comments: Breathing comfortably at rest, CTABL, no wheezing, rales or other adventitious sounds auscultated Abdominal:     Palpations: Abdomen is soft.     Tenderness: There is no abdominal tenderness.  Skin:    General: Skin is warm and dry.  Neurological:     Mental Status: She is alert.      UC Treatments / Results  Labs (all labs ordered are listed, but only abnormal results are displayed) Labs Reviewed - No data to display  EKG None  Radiology No results found.  Procedures Procedures (including critical care time)  Medications Ordered in UC Medications - No data to display  Initial Impression / Assessment and Plan / UC Course  I have reviewed the triage vital signs and the nursing notes.  Pertinent labs & imaging results that were available during  my care of the patient were reviewed by me and considered in my medical decision making (see chart for details).     URI symptoms x2 days with fever, [redacted] weeks pregnant.  Symptoms flulike.  Will treat as such and will provide Tamiflu given patient within Tamiflu window and pregnant.  Also recommended further symptomatic and supportive care.  Provided list of medicines safe in pregnancy, discussed contacting OB/GYN to confirm.  Rest, fluids.Discussed strict return precautions. Patient verbalized understanding and is agreeable with plan.  Final Clinical Impressions(s) / UC Diagnoses   Final diagnoses:  Influenza-like illness     Discharge Instructions     Symptoms concerning for flu You may begin tamiflu twice daily for 5 days Please reference list of medicines safe in pregnancy that you may try to further help with congestion/cough Drink plenty of fluids Rest Viral illnesses usually resolve over 1-2 weeks  Diclegis: Two tablets at bedtime on day 1 and 2; if symptoms persist, take 1 tablet in morning and 2 tablets at bedtime on day 3; if symptoms persist, may increase to 1 tablet in morning, 1 tablet mid-afternoon, and 2 tablets at bedtime on day 4 (maximum: doxylamine 40 mg/pyridoxine 40 mg (4 tablets) per day). OR One-half of the 25 mg Unisom sleep tablet over-the-counter tablet or two chewable 5 mg tablets can be used off-label as an antiemetic. In addition, pyridoxine 25 mg, also available over-the-counter, is taken three or four times per day;This is a reasonable, less expensive substitute for combination tablets.  Follow up if fever persisting, developing worsening chest discomfort, shortness of breath, difficulty breathing, persistent symptoms   ED Prescriptions    Medication Sig Dispense Auth. Provider   oseltamivir (TAMIFLU) 75 MG capsule Take 1 capsule (75 mg total) by mouth every 12 (twelve) hours for 5 days. 10 capsule Arel Tippen C, PA-C   Doxylamine-Pyridoxine 10-10 MG  TBEC Take 10 mg by mouth 2 (two) times daily. 30 tablet Kanda Deluna, Herminie C, PA-C     Controlled Substance Prescriptions [place] Controlled Substance Registry consulted? Not Applicable   Janith Lima, [place] 10/13/18 1428

## 2018-11-27 ENCOUNTER — Ambulatory Visit (HOSPITAL_COMMUNITY)
Admission: EM | Admit: 2018-11-27 | Discharge: 2018-11-27 | Disposition: A | Discharge status: Home / Self Care | Payer: BC Managed Care – PPO | Attending: Family Medicine | Admitting: Family Medicine

## 2018-11-27 ENCOUNTER — Encounter (HOSPITAL_COMMUNITY): Payer: Self-pay

## 2018-11-27 DIAGNOSIS — J0100 Acute maxillary sinusitis, unspecified: Secondary | ICD-10-CM

## 2018-11-27 MED ORDER — AMOXICILLIN 500 MG PO CAPS: 1000.0000 mg | ORAL_CAPSULE | Freq: Three times a day (TID) | ORAL | 0 refills | Status: AC

## 2018-11-27 NOTE — ED Triage Notes (Signed)
Pt presents with sinus pressure/pain, chills, sore throat, nasal drainage, and congestion.

## 2018-11-27 NOTE — ED Provider Notes (Signed)
[facility]    CSN: [id number] Arrival date & time: 11/27/18  1101     History   Chief Complaint Chief Complaint  Patient presents with  . Appointment  . (11:00)  URI    HPI Jennifer Randall is a 28 y.o. female.   Pt is a 28 year old female that is [redacted] weeks pregnant. She comes in today for sinus pressure/pain, dental pain, ear fullness,  chills, fever, thick mucous, drainage and congestion. She has been sick since Saturday with cough and congestion. The cough somewhat subsided but the sinus problem remains and has worsened.  Fever 100.3 reported at home and she took tylenol. Her son was recently sick with URI symptoms and is currently being treated for double ear infection. No recent traveling.   ROS per HPI      Past Medical History:  Diagnosis Date  . Heart murmur     Patient Active Problem List   Diagnosis Date Noted  . Postpartum state 06/12/2016  . Indication for care or intervention related to labor and delivery 06/10/2016    Past Surgical History:  Procedure Laterality Date  . TONSILLECTOMY    . TYMPANOSTOMY TUBE PLACEMENT      OB History    Gravida  2   Para  1   Term  1   Preterm  0   AB  0   Living  1     SAB  0   TAB  0   Ectopic  0   Multiple  0   Live Births  1            Home Medications    Prior to Admission medications   Medication Sig Start Date End Date Taking? Authorizing Provider  amoxicillin (AMOXIL) 500 MG capsule Take 2 capsules (1,000 mg total) by mouth 3 (three) times daily for 7 days. 11/27/18 12/04/18  Loura Halt A, NP  Doxylamine-Pyridoxine 10-10 MG TBEC Take 10 mg by mouth 2 (two) times daily. 10/13/18   Wieters, Hallie C, PA-C  Prenatal Vit-Fe Fumarate-FA (MULTIVITAMIN-PRENATAL) 27-0.8 MG TABS tablet Take 1 tablet by mouth daily at 12 noon.     [provider]    Family History Family History  Problem Relation Age of Onset  . Cancer Father   . Hyperlipidemia Maternal Grandmother       Social History Social History   Tobacco Use  . Smoking status: Never Smoker  . Smokeless tobacco: Never Used  Substance Use Topics  . Alcohol use: No    Alcohol/week: 0.0 standard drinks  . Drug use: No     Allergies   Sulfa antibiotics   Review of Systems Review of Systems   Physical Exam Triage Vital Signs ED Triage Vitals  Enc Vitals Group     BP 11/27/18 1122 114/72     Pulse Rate 11/27/18 1122 (!) 114     Resp 11/27/18 1122 18     Temp 11/27/18 1122 98.2 F (36.8 C)     Temp Source 11/27/18 1122 Oral     SpO2 11/27/18 1122 100 %     Weight --      Height --      Head Circumference --      Peak Flow --      Pain Score 11/27/18 1124 5     Pain Loc --      Pain Edu? --      Excl. in [place]? --    No  data found.  Updated Vital Signs BP 114/72 (BP Location: Left Arm)   Pulse (!) 114   Temp 98.2 F (36.8 C) (Oral)   Resp 18   SpO2 100%   Visual Acuity Right Eye Distance:   Left Eye Distance:   Bilateral Distance:    Right Eye Near:   Left Eye Near:    Bilateral Near:     Physical Exam Vitals signs and nursing note reviewed.  Constitutional:      Appearance: Normal appearance. She is ill-appearing.  HENT:     Head: Normocephalic and atraumatic.     Right Ear: Tympanic membrane and ear canal normal.     Left Ear: Tympanic membrane and ear canal normal.     Nose: Congestion and rhinorrhea present.     Right Turbinates: Swollen.     Left Turbinates: Swollen.     Left Sinus: Maxillary sinus tenderness present.     Mouth/Throat:     Pharynx: Oropharynx is clear.  Eyes:     Conjunctiva/sclera: Conjunctivae normal.  Neck:     Musculoskeletal: Normal range of motion.  Cardiovascular:     Rate and Rhythm: Regular rhythm. Tachycardia present.     Pulses: Normal pulses.     Heart sounds: Normal heart sounds.  Pulmonary:     Effort: Pulmonary effort is normal.     Breath sounds: Normal breath sounds.  Musculoskeletal: Normal range of motion.   Skin:    General: Skin is warm and dry.  Neurological:     Mental Status: She is alert.  Psychiatric:        Mood and Affect: Mood normal.      UC Treatments / Results  Labs (all labs ordered are listed, but only abnormal results are displayed) Labs Reviewed - No data to display  EKG None  Radiology No results found.  Procedures Procedures (including critical care time)  Medications Ordered in UC Medications - No data to display  Initial Impression / Assessment and Plan / UC Course  I have reviewed the triage vital signs and the nursing notes.  Pertinent labs & imaging results that were available during my care of the patient were reviewed by me and considered in my medical decision making (see chart for details).     Symptoms consistent with sinus infection Will treat with amoxicillin 3 x day for 7 days.  Tylenol for fever She can do Flonase and zyrtec also for symptoms.  Follow up as needed for continued or worsening symptoms  Final Clinical Impressions(s) / UC Diagnoses   Final diagnoses:  Acute non-recurrent maxillary sinusitis     Discharge Instructions     We will go ahead and treat you for a sinus infection Amoxicillin 3 times a day for 7 days Continue the nasal saline spray You can even try Flonase nasal spray over-the-counter  and zyrtec to see if this helps with your symptoms These are safe in pregnancy.  Follow up as needed for continued or worsening symptoms     ED Prescriptions    Medication Sig Dispense Auth. Provider   amoxicillin (AMOXIL) 500 MG capsule Take 2 capsules (1,000 mg total) by mouth 3 (three) times daily for 7 days. 40 capsule Loura Halt A, NP     Controlled Substance Prescriptions [place] Controlled Substance Registry consulted? Not Applicable   Orvan July, NP 11/27/18 [phone]

## 2018-11-27 NOTE — Discharge Instructions (Addendum)
We will go ahead and treat you for a sinus infection Amoxicillin 3 times a day for 7 days Continue the nasal saline spray You can even try Flonase nasal spray over-the-counter  and zyrtec to see if this helps with your symptoms These are safe in pregnancy.  Follow up as needed for continued or worsening symptoms

## 2018-11-27 NOTE — ED Triage Notes (Signed)
Pt also complains of coughing up green mucus.  Pt is [redacted] weeks pregnant.

## 2019-02-26 ENCOUNTER — Inpatient Hospital Stay (HOSPITAL_COMMUNITY)
Admission: RE | Admit: 2019-02-26 | Discharge: 2019-02-26 | Disposition: A | Discharge status: Home / Self Care | Payer: BC Managed Care – PPO | Attending: Obstetrics and Gynecology | Admitting: Obstetrics and Gynecology

## 2019-02-26 ENCOUNTER — Encounter (HOSPITAL_COMMUNITY): Payer: Self-pay

## 2019-02-26 ENCOUNTER — Other Ambulatory Visit: Payer: Self-pay

## 2019-02-26 DIAGNOSIS — O479 False labor, unspecified: Secondary | ICD-10-CM

## 2019-02-26 DIAGNOSIS — Z3A37 37 weeks gestation of pregnancy: Secondary | ICD-10-CM | POA: Diagnosis not present

## 2019-02-26 DIAGNOSIS — O471 False labor at or after 37 completed weeks of gestation: Secondary | ICD-10-CM | POA: Diagnosis not present

## 2019-02-26 DIAGNOSIS — Z3689 Encounter for other specified antenatal screening: Secondary | ICD-10-CM

## 2019-02-26 NOTE — MAU Note (Signed)
Pt states that she started having ctx's at 0800 this morning.   Pt states that they stopped at 1300.   She states that they started up about 3 hours ago and are 3 minutes apart.   Denies vaginal bleeding or LOF.    Reports +fm

## 2019-02-26 NOTE — Discharge Instructions (Signed)

## 2019-02-26 NOTE — MAU Provider Note (Signed)
S: Ms. KEARIE MENNEN is a 28 y.o. G2P1001 at [redacted]w[redacted]d  who presents to MAU today for labor evaluation.     Cervical exam by RN:  Dilation: 1 Effacement (%): Thick Cervical Position: Posterior Station: -3 Presentation: Vertex Exam by:: Elray Mcgregor, RN   Fetal Monitoring: Baseline: 140 Variability: average Accelerations: present Decelerations: absent Contractions: irregular every 2-5 min  MDM Discussed patient with RN. NST reviewed.   A: SIUP at [redacted]w[redacted]d  False labor  P: Discharge home Labor precautions and kick counts included in AVS [facility]  Patient may return to MAU as needed or when in labor   Seabron Spates, [place] 02/26/2019 2:29 AM

## 2019-03-17 ENCOUNTER — Other Ambulatory Visit: Payer: Self-pay

## 2019-03-17 ENCOUNTER — Encounter (HOSPITAL_COMMUNITY): Payer: Self-pay | Admitting: *Deleted

## 2019-03-17 ENCOUNTER — Inpatient Hospital Stay (HOSPITAL_COMMUNITY)
Admission: AD | Admit: 2019-03-17 | Discharge: 2019-03-19 | DRG: 807 | Disposition: A | Discharge status: Home / Self Care | Payer: BC Managed Care – PPO | Attending: Obstetrics and Gynecology | Admitting: Obstetrics and Gynecology

## 2019-03-17 DIAGNOSIS — Z1159 Encounter for screening for other viral diseases: Secondary | ICD-10-CM | POA: Diagnosis not present

## 2019-03-17 DIAGNOSIS — Z3A40 40 weeks gestation of pregnancy: Secondary | ICD-10-CM

## 2019-03-17 DIAGNOSIS — O26893 Other specified pregnancy related conditions, third trimester: Secondary | ICD-10-CM | POA: Diagnosis present

## 2019-03-17 HISTORY — DX: Depression, unspecified: F32.A

## 2019-03-17 LAB — CBC
HCT: 34.3 % — ABNORMAL LOW (ref 36.0–46.0)
Hemoglobin: 11.1 g/dL — ABNORMAL LOW (ref 12.0–15.0)
MCH: 28.2 pg (ref 26.0–34.0)
MCHC: 32.4 g/dL (ref 30.0–36.0)
MCV: 87.1 fL (ref 80.0–100.0)
Platelets: 230 10*3/uL (ref 150–400)
RBC: 3.94 MIL/uL (ref 3.87–5.11)
RDW: 13.6 % (ref 11.5–15.5)
WBC: 18.8 10*3/uL — ABNORMAL HIGH (ref 4.0–10.5)
nRBC: 0 % (ref 0.0–0.2)

## 2019-03-17 LAB — TYPE AND SCREEN
ABO/RH(D): O POS
Antibody Screen: NEGATIVE

## 2019-03-17 LAB — POCT FERN TEST: POCT Fern Test: POSITIVE

## 2019-03-17 LAB — SARS CORONAVIRUS 2: SARS Coronavirus 2: NOT DETECTED

## 2019-03-17 MED ORDER — BUTORPHANOL TARTRATE 1 MG/ML IJ SOLN
1.0000 mg | INTRAMUSCULAR | Status: DC | PRN
  Administered 2019-03-17: 1 mg via INTRAVENOUS
  Filled 2019-03-17: qty 1

## 2019-03-17 MED ORDER — LACTATED RINGERS IV SOLN: 500.0000 mL | INTRAVENOUS | Status: DC | PRN

## 2019-03-17 MED ORDER — OXYCODONE-ACETAMINOPHEN 5-325 MG PO TABS: 2.0000 | ORAL_TABLET | ORAL | Status: DC | PRN

## 2019-03-17 MED ORDER — OXYCODONE-ACETAMINOPHEN 5-325 MG PO TABS: 1.0000 | ORAL_TABLET | ORAL | Status: DC | PRN

## 2019-03-17 MED ORDER — TERBUTALINE SULFATE 1 MG/ML IJ SOLN: 0.2500 mg | Freq: Once | INTRAMUSCULAR | Status: DC | PRN

## 2019-03-17 MED ORDER — OXYTOCIN 40 UNITS IN NORMAL SALINE INFUSION - SIMPLE MED
2.5000 [IU]/h | INTRAVENOUS | Status: DC
  Administered 2019-03-18: 2.5 [IU]/h via INTRAVENOUS
  Filled 2019-03-17: qty 1000

## 2019-03-17 MED ORDER — ONDANSETRON HCL 4 MG/2ML IJ SOLN: 4.0000 mg | Freq: Four times a day (QID) | INTRAMUSCULAR | Status: DC | PRN

## 2019-03-17 MED ORDER — ACETAMINOPHEN 325 MG PO TABS: 650.0000 mg | ORAL_TABLET | ORAL | Status: DC | PRN

## 2019-03-17 MED ORDER — OXYTOCIN BOLUS FROM INFUSION
500.0000 mL | Freq: Once | INTRAVENOUS | Status: AC
  Administered 2019-03-18: 500 mL via INTRAVENOUS

## 2019-03-17 MED ORDER — SOD CITRATE-CITRIC ACID 500-334 MG/5ML PO SOLN: 30.0000 mL | ORAL | Status: DC | PRN

## 2019-03-17 MED ORDER — LIDOCAINE HCL (PF) 1 % IJ SOLN
30.0000 mL | INTRAMUSCULAR | Status: DC | PRN
  Administered 2019-03-18: 30 mL via SUBCUTANEOUS
  Filled 2019-03-17: qty 30

## 2019-03-17 MED ORDER — OXYTOCIN 40 UNITS IN NORMAL SALINE INFUSION - SIMPLE MED: 1.0000 m[IU]/min | INTRAVENOUS | Status: DC

## 2019-03-17 MED ORDER — LACTATED RINGERS IV SOLN
INTRAVENOUS | Status: DC
  Administered 2019-03-17 – 2019-03-18 (×3): via INTRAVENOUS

## 2019-03-17 MED ORDER — FLEET ENEMA 7-19 GM/118ML RE ENEM: 1.0000 | ENEMA | RECTAL | Status: DC | PRN

## 2019-03-17 NOTE — MAU Note (Signed)
Slow leak, started at 1115, little bit of a gush, been trickling and on the way in had gush.  Ctxs started after she started leaking.

## 2019-03-17 NOTE — H&P (Signed)
28 y.o. [redacted]w[redacted]d  G2P1001 comes in with LOF since 11:15.  Otherwise has good fetal movement and no complaints.  Past Medical History:  Diagnosis Date  . Depression   . Heart murmur     Past Surgical History:  Procedure Laterality Date  . TONSILLECTOMY    . TYMPANOSTOMY TUBE PLACEMENT      OB History  Gravida Para Term Preterm AB Living  2 1 1  0 0 1  SAB TAB Ectopic Multiple Live Births  0 0 0 0 1    # Outcome Date GA Lbr Len/2nd Weight Sex Delivery Anes PTL Lv  2 Current           1 Term 06/12/16 [redacted]w[redacted]d 61:33 3820 g M Vag-Spont EPI  LIV    Social History   Socioeconomic History  . Marital status: Married    Spouse name: Not on file  . Number of children: Not on file  . Years of education: Not on file  . Highest education level: Not on file  Occupational History  . Not on file  Social Needs  . Financial resource strain: Not on file  . Food insecurity:    Worry: Not on file    Inability: Not on file  . Transportation needs:    Medical: Not on file    Non-medical: Not on file  Tobacco Use  . Smoking status: Never Smoker  . Smokeless tobacco: Never Used  Substance and Sexual Activity  . Alcohol use: No    Alcohol/week: 0.0 standard drinks  . Drug use: No  . Sexual activity: Yes    Comment: No protection, fiancee  Lifestyle  . Physical activity:    Days per week: Not on file    Minutes per session: Not on file  . Stress: Not on file  Relationships  . Social connections:    Talks on phone: Not on file    Gets together: Not on file    Attends religious service: Not on file    Active member of club or organization: Not on file    Attends meetings of clubs or organizations: Not on file    Relationship status: Not on file  . Intimate partner violence:    Fear of current or ex partner: Not on file    Emotionally abused: Not on file    Physically abused: Not on file    Forced sexual activity: Not on file  Other Topics Concern  . Not on file  Social History  Narrative  . Not on file   Sulfa antibiotics    Prenatal Transfer Tool  Maternal Diabetes: No Genetic Screening: Declined Maternal Ultrasounds/Referrals: Normal Fetal Ultrasounds or other Referrals:  None Maternal Substance Abuse:  No Significant Maternal Medications:  None Significant Maternal Lab Results: Lab values include: Group B Strep negative  Other PNC: uncomplicated.    Vitals:   03/17/19 1404 03/17/19 1536  BP: 107/68 111/71  Pulse: (!) 104 (!) 108  Resp: 18 16  Temp:  97.6 F (36.4 C)  TempSrc: Oral Axillary  SpO2: 100%   Weight: 71.1 kg   Height: 5\' 3"  (1.6 m)     Lungs/Cor:  NAD Abdomen:  soft, gravid Ex:  no cords, erythema SVE: 2/Th/-2 FHTs:  135, good STV, NST R; Cat 1 tracing. Toco:  q 2-4   A/P   Admit with SROM  GBS Neg  Epidural when desired  Other routine care  Allyn Kenner

## 2019-03-18 ENCOUNTER — Encounter (HOSPITAL_COMMUNITY): Payer: Self-pay

## 2019-03-18 LAB — ABO/RH: ABO/RH(D): O POS

## 2019-03-18 LAB — CBC
HCT: 32.4 % — ABNORMAL LOW (ref 36.0–46.0)
Hemoglobin: 10.5 g/dL — ABNORMAL LOW (ref 12.0–15.0)
MCH: 28.1 pg (ref 26.0–34.0)
MCHC: 32.4 g/dL (ref 30.0–36.0)
MCV: 86.6 fL (ref 80.0–100.0)
Platelets: 235 10*3/uL (ref 150–400)
RBC: 3.74 MIL/uL — ABNORMAL LOW (ref 3.87–5.11)
RDW: 13.6 % (ref 11.5–15.5)
WBC: 26.3 10*3/uL — ABNORMAL HIGH (ref 4.0–10.5)
nRBC: 0 % (ref 0.0–0.2)

## 2019-03-18 LAB — RPR: RPR Ser Ql: NONREACTIVE

## 2019-03-18 MED ORDER — COCONUT OIL OIL
1.0000 "application " | TOPICAL_OIL | Status: DC | PRN
  Administered 2019-03-19: 1 via TOPICAL

## 2019-03-18 MED ORDER — PRENATAL MULTIVITAMIN CH
1.0000 | ORAL_TABLET | Freq: Every day | ORAL | Status: DC
  Administered 2019-03-18: 1 via ORAL
  Filled 2019-03-18 (×2): qty 1

## 2019-03-18 MED ORDER — DIPHENHYDRAMINE HCL 25 MG PO CAPS: 25.0000 mg | ORAL_CAPSULE | Freq: Four times a day (QID) | ORAL | Status: DC | PRN

## 2019-03-18 MED ORDER — ACETAMINOPHEN 325 MG PO TABS: 650.0000 mg | ORAL_TABLET | ORAL | Status: DC | PRN

## 2019-03-18 MED ORDER — DIBUCAINE (PERIANAL) 1 % EX OINT: 1.0000 "application " | TOPICAL_OINTMENT | CUTANEOUS | Status: DC | PRN

## 2019-03-18 MED ORDER — ONDANSETRON HCL 4 MG/2ML IJ SOLN: 4.0000 mg | INTRAMUSCULAR | Status: DC | PRN

## 2019-03-18 MED ORDER — IBUPROFEN 600 MG PO TABS
600.0000 mg | ORAL_TABLET | Freq: Four times a day (QID) | ORAL | Status: DC
  Administered 2019-03-18 – 2019-03-19 (×5): 600 mg via ORAL
  Filled 2019-03-18 (×6): qty 1

## 2019-03-18 MED ORDER — TETANUS-DIPHTH-ACELL PERTUSSIS 5-2.5-18.5 LF-MCG/0.5 IM SUSP: 0.5000 mL | Freq: Once | INTRAMUSCULAR | Status: DC

## 2019-03-18 MED ORDER — OXYCODONE-ACETAMINOPHEN 5-325 MG PO TABS: 2.0000 | ORAL_TABLET | ORAL | Status: DC | PRN

## 2019-03-18 MED ORDER — SENNOSIDES-DOCUSATE SODIUM 8.6-50 MG PO TABS
2.0000 | ORAL_TABLET | ORAL | Status: DC
  Administered 2019-03-18: 2 via ORAL
  Filled 2019-03-18: qty 2

## 2019-03-18 MED ORDER — ZOLPIDEM TARTRATE 5 MG PO TABS: 5.0000 mg | ORAL_TABLET | Freq: Every evening | ORAL | Status: DC | PRN

## 2019-03-18 MED ORDER — ONDANSETRON HCL 4 MG PO TABS: 4.0000 mg | ORAL_TABLET | ORAL | Status: DC | PRN

## 2019-03-18 MED ORDER — SIMETHICONE 80 MG PO CHEW: 80.0000 mg | CHEWABLE_TABLET | ORAL | Status: DC | PRN

## 2019-03-18 MED ORDER — WITCH HAZEL-GLYCERIN EX PADS: 1.0000 "application " | MEDICATED_PAD | CUTANEOUS | Status: DC | PRN

## 2019-03-18 MED ORDER — LIDOCAINE HCL (PF) 1 % IJ SOLN
INTRAMUSCULAR | Status: AC
  Filled 2019-03-18: qty 30

## 2019-03-18 MED ORDER — BENZOCAINE-MENTHOL 20-0.5 % EX AERO
1.0000 "application " | INHALATION_SPRAY | CUTANEOUS | Status: DC | PRN
  Administered 2019-03-18: 1 via TOPICAL
  Filled 2019-03-18: qty 56

## 2019-03-18 MED ORDER — OXYCODONE-ACETAMINOPHEN 5-325 MG PO TABS: 1.0000 | ORAL_TABLET | ORAL | Status: DC | PRN

## 2019-03-18 NOTE — Lactation Note (Signed)
This note was copied from a baby's chart. Lactation Consultation Note  Patient Name: Jennifer Randall VEHMC'N Date: 03/18/2019 Reason for consult: Follow-up assessment;Mother's request  2130 - 2141 - Ms. Horn paged for a latch assist. Baby just had a meconium diaper prior to entry.  I assisted mom with positioning baby in cross cradle to cradle hold on her right breast. I showed mom how to gently tug baby's chin to create a deeper latch. Mom reports that this improved her comfort at the breast. Baby has a slightly tight/closed mouth latch. Rhythmic suckling sequences observed.   Maternal Data Formula Feeding for Exclusion: No Has patient been taught Hand Expression?: Yes Does the patient have breastfeeding experience prior to this delivery?: Yes  Feeding Feeding Type: Breast Fed  LATCH Score Latch: Grasps breast easily, tongue down, lips flanged, rhythmical sucking.  Audible Swallowing: A few with stimulation  Type of Nipple: Everted at rest and after stimulation  Comfort (Breast/Nipple): Soft / non-tender  Hold (Positioning): Assistance needed to correctly position infant at breast and maintain latch.  LATCH Score: 8  Interventions Interventions: Breast feeding basics reviewed;Assisted with latch;Skin to skin;Hand express;Support pillows;Adjust position   Consult Status Consult Status: Follow-up Date: 03/19/19    Lenore Manner 03/18/2019, 10:33 PM

## 2019-03-18 NOTE — Progress Notes (Signed)
[name] was referred for history of depression.  * Referral screened out by Clinical Social Worker because none of the following criteria appear to apply:  -History of anxiety/depression during this pregnancy, or of post-partum depression following prior delivery. -Diagnosis of anxiety and/or depression within last 3 years. Per chart review, [name]'s anxiety dates back to 2016 and no concerns were noted in [facility] records. OR * [name]'s symptoms currently being treated with medication and/or therapy.  Please contact the Clinical Social Worker if needs arise, by [facility] request, or if [name] scores greater than 9/yes to question 10 on Edinburgh Postpartum Depression Screen.  Jennifer Randall, [place]  [facility] [employer] [phone]

## 2019-03-18 NOTE — Progress Notes (Signed)
Post Partum Day 0 Subjective: no complaints, up ad lib, voiding and tolerating PO  Objective: Blood pressure 91/60, pulse 88, temperature 97.6 F (36.4 C), temperature source Axillary, resp. rate 16, height 5\' 3"  (1.6 m), weight 71.1 kg, SpO2 99 %, unknown if currently breastfeeding.  Physical Exam:  General: alert Lochia: appropriate Uterine Fundus: firm DVT Evaluation: No evidence of DVT seen on physical exam.  Recent Labs    03/17/19 1503 03/18/19 0456  HGB 11.1* 10.5*  HCT 34.3* 32.4*    Assessment/Plan: Plan for discharge tomorrow and Breastfeeding   LOS: 1 day   Vanessa Kick 03/18/2019, 11:09 AM

## 2019-03-18 NOTE — Lactation Note (Signed)
This note was copied from a baby's chart. Lactation Consultation Note  Patient Name: Jennifer Randall WPYKD'X Date: 03/18/2019 Reason for consult: Initial assessment;Term  26 - 27 - I visited [name] to conduct breast feeding education. She sates that her daughter, [name], has been breast feeding well so far. She notes that she latches more readily than her son did, who needed a nipple shield.  Mom has small nipples and large areolas. They appear slightly puffy, and mom states that this is her normal anatomy. She can hand express colostrum. I showed mom how to do RPS to help evert her nipple. Her nipples are short, but they evert well.  She has two small blisters on her nipples in the center and slight redness around the nipple. She denies pain with latch, but admits that something must be off if she has these blisters. I gave her comfort gels and also recommended that she hand express colostrum. When she does HE, colostrum exits the milk duct under the blister.  Mom fed [name] around 7. I recommended that we see a feeding before she is discharged to help troubleshoot the latch. She agreed and will call when ready. She states that baby has been a little sleepy (falling asleep on the breast). I gave mom and spoon and encouraged her to express some milk and spoon feed her to help whet her appetite.  [name] has a DEBP at home. We reviewed breast feeding basics, day 1 infant feeding patterns, output expectations, etc. Mom will call when ready for help.  Maternal Data Formula Feeding for Exclusion: No Has patient been taught Hand Expression?: Yes Does the patient have breastfeeding experience prior to this delivery?: Yes  Feeding Feeding Type: Breast Fed  LATCH Score                   Interventions Interventions: Breast feeding basics reviewed;Hand express  Lactation Tools Discussed/Used     Consult Status Consult Status: Follow-up Date: 03/19/19    Lenore Manner 03/18/2019, 7:55 PM

## 2019-03-19 ENCOUNTER — Ambulatory Visit: Payer: Self-pay

## 2019-03-19 NOTE — Progress Notes (Signed)
Patient is eating, ambulating, voiding.  Pain control is good.  Vitals:   03/18/19 1000 03/18/19 1700 03/18/19 2110 03/19/19 0550  BP: 91/60 95/68 105/77 102/70  Pulse: 88 86 75 70  Resp: 16 16 16 16   Temp: 97.6 F (36.4 C) 98.5 F (36.9 C) 97.8 F (36.6 C) 98.1 F (36.7 C)  TempSrc: Axillary Oral Oral   SpO2: 99% 100% 100% 100%  Weight:      Height:        Fundus firm Perineum without swelling.  Lab Results  Component Value Date   WBC 26.3 (H) 03/18/2019   HGB 10.5 (L) 03/18/2019   HCT 32.4 (L) 03/18/2019   MCV 86.6 03/18/2019   PLT 235 03/18/2019    --/--/O POS (06/09 1503)/RI  A/P Post partum day 2.  Routine care.  Expect d/c today.    Daria Pastures

## 2019-03-19 NOTE — Lactation Note (Signed)
This note was copied from a baby's chart. Lactation Consultation Note  Patient Name: Jennifer Randall AFOAD'L Date: 03/19/2019   Mom says her nipples are sore. After talking with Mom, it seems that her latch technique may be the cause. Specifics of an asymmetric latch shown via [employer].   Infant was sleeping at beginning of consult, despite Mom trying to wake infant. Hand expression was taught to Mom & infant was finger-fed a small amt of EBM to see if it would entice infant to wake. This LC will return in 20 minutes.   Matthias Hughs [facility] 03/19/2019, 10:54 AM

## 2019-03-19 NOTE — Discharge Summary (Signed)
Obstetric Discharge Summary Reason for Admission: rupture of membranes Prenatal Procedures: none Intrapartum Procedures: spontaneous vaginal delivery Postpartum Procedures: none Complications-Operative and Postpartum: 1 degree perineal laceration Hemoglobin  Date Value Ref Range Status  03/18/2019 10.5 (L) 12.0 - 15.0 g/dL Final   HCT  Date Value Ref Range Status  03/18/2019 32.4 (L) 36.0 - 46.0 % Final    Discharge Diagnoses: Term Pregnancy-delivered  Discharge Information: Date: 03/19/2019 Activity: pelvic rest Diet: routine Medications: Ibuprofen Condition: stable Instructions: refer to practice specific booklet Discharge to: home Follow-up Information    Allyn Kenner, DO Follow up in 4 week(s).   Specialty: Obstetrics and Gynecology Contact information: [address] [place] [address] [phone]           Newborn Data: Live born female  Birth Weight: 7 lb 9.5 oz (3445 g) APGAR: 8, 9  Newborn Delivery   Birth date/time: 03/18/2019 01:54:00 Delivery type: Vaginal, Spontaneous      Home with mother.  Jennifer Randall 03/19/2019, 9:49 AM

## 2019-03-19 NOTE — Lactation Note (Signed)
This note was copied from a baby's chart. Lactation Consultation Note  Patient Name: Jennifer Randall TJQZE'S Date: 03/19/2019   I returned to room to see if infant was ready to eat. Infant was not ready, so Mom hand expressed into a colostrum vial. Mom does an excellent job of hand expressing. Infant was finger-fed a little more than 1 mL with a curved-tip syringe. Infant sucked well on finger to receive EBM, but infant did not awake to go to the breast. Parents are ready to go home. Mom says she feels comfortable repeating this at home as needed until infant awakes & ready to feed. Mom says they live 15 minutes away. She will repeat the above once they get home. I suggested that she aim to get in 5-7 mL at subsequent attempts.   Mom had an abundant supply with her 1st child & feels that her breasts are heavier. Mom has a Spectra pump at home & knows she will likely need to order the size 21 flanges.   Matthias Hughs [facility] 03/19/2019, 1:33 PM

## 2019-09-27 ENCOUNTER — Other Ambulatory Visit: Payer: Self-pay

## 2019-09-27 ENCOUNTER — Encounter (HOSPITAL_COMMUNITY): Payer: Self-pay

## 2019-09-27 ENCOUNTER — Ambulatory Visit (HOSPITAL_COMMUNITY)
Admission: EM | Admit: 2019-09-27 | Discharge: 2019-09-27 | Disposition: A | Discharge status: Home / Self Care | Payer: BC Managed Care – PPO | Attending: Family Medicine | Admitting: Family Medicine

## 2019-09-27 DIAGNOSIS — J029 Acute pharyngitis, unspecified: Secondary | ICD-10-CM

## 2019-09-27 DIAGNOSIS — Z20828 Contact with and (suspected) exposure to other viral communicable diseases: Secondary | ICD-10-CM | POA: Insufficient documentation

## 2019-09-27 DIAGNOSIS — J028 Acute pharyngitis due to other specified organisms: Secondary | ICD-10-CM | POA: Insufficient documentation

## 2019-09-27 DIAGNOSIS — Z79899 Other long term (current) drug therapy: Secondary | ICD-10-CM | POA: Insufficient documentation

## 2019-09-27 DIAGNOSIS — B9789 Other viral agents as the cause of diseases classified elsewhere: Secondary | ICD-10-CM | POA: Insufficient documentation

## 2019-09-27 DIAGNOSIS — R0981 Nasal congestion: Secondary | ICD-10-CM | POA: Diagnosis not present

## 2019-09-27 LAB — POCT RAPID STREP A: Streptococcus, Group A Screen (Direct): NEGATIVE

## 2019-09-27 NOTE — ED Provider Notes (Signed)
[facility]    CSN: [id number] Arrival date & time: 09/27/19  1423      History   Chief Complaint Chief Complaint  Patient presents with  . Sore Throat    HPI Jennifer Randall is a 28 y.o. female.   Patient reports to urgent care today for 10 days of sore throat. She reports pain primarily in the morning that gets better as the day goes. She endorses some nasal congestion and runny nose at night. She has some mild pain with swallowing. Ibuprofen has helped somewhat. She states it is neither getting better or worse. She reports her child was sick just prior to her symptoms starting. He is in daycare. She denies fever, chills, cough, headache, loss of smell or taste.   She is currently breastfeeding without issue.     Past Medical History:  Diagnosis Date  . Depression   . Heart murmur     Patient Active Problem List   Diagnosis Date Noted  . Normal labor 03/17/2019  . Postpartum state 06/12/2016  . Indication for care or intervention related to labor and delivery 06/10/2016    Past Surgical History:  Procedure Laterality Date  . TONSILLECTOMY    . TYMPANOSTOMY TUBE PLACEMENT      OB History    Gravida  2   Para  2   Term  2   Preterm  0   AB  0   Living  2     SAB  0   TAB  0   Ectopic  0   Multiple  0   Live Births  2            Home Medications    Prior to Admission medications   Medication Sig Start Date End Date Taking? Authorizing Provider  Doxylamine-Pyridoxine 10-10 MG TBEC Take 10 mg by mouth 2 (two) times daily. 10/13/18   Wieters, Hallie C, PA-C  Prenatal Vit-Fe Fumarate-FA (MULTIVITAMIN-PRENATAL) 27-0.8 MG TABS tablet Take 1 tablet by mouth daily at 12 noon.     [provider]    Family History Family History  Problem Relation Age of Onset  . Cancer Father   . Hyperlipidemia Maternal Grandmother     Social History Social History   Tobacco Use  . Smoking status: Never Smoker  . Smokeless  tobacco: Never Used  Substance Use Topics  . Alcohol use: No    Alcohol/week: 0.0 standard drinks  . Drug use: No     Allergies   Sulfa antibiotics   Review of Systems Review of Systems  Constitutional: Negative for activity change, chills, fatigue and fever.  HENT: Positive for congestion, postnasal drip, rhinorrhea and sore throat. Negative for ear pain, sinus pressure and sinus pain.   Eyes: Negative for pain and visual disturbance.  Respiratory: Negative for cough and shortness of breath.   Cardiovascular: Negative for chest pain and palpitations.  Gastrointestinal: Negative for abdominal pain, nausea and vomiting.  Musculoskeletal: Negative for arthralgias, back pain and myalgias.  Skin: Negative for color change and rash.  Neurological: Negative for seizures, syncope and headaches.  Hematological: Negative for adenopathy.  All other systems reviewed and are negative.    Physical Exam Triage Vital Signs ED Triage Vitals  Enc Vitals Group     BP 09/27/19 1445 99/68     Pulse Rate 09/27/19 1445 88     Resp 09/27/19 1445 16     Temp 09/27/19 1445 98.6 F (37 C)  Temp Source 09/27/19 1445 Oral     SpO2 09/27/19 1445 100 %     Weight 09/27/19 1442 125 lb (56.7 kg)     Height --      Head Circumference --      Peak Flow --      Pain Score 09/27/19 1442 4     Pain Loc --      Pain Edu? --      Excl. in [place]? --    No data found.  Updated Vital Signs BP 99/68 (BP Location: Right Arm)   Pulse 88   Temp 98.6 F (37 C) (Oral)   Resp 16   Wt 125 lb (56.7 kg)   SpO2 100%   Breastfeeding Yes   BMI 22.14 kg/m   Visual Acuity Right Eye Distance:   Left Eye Distance:   Bilateral Distance:    Right Eye Near:   Left Eye Near:    Bilateral Near:     Physical Exam Vitals and nursing note reviewed.  Constitutional:      General: She is not in acute distress.    Appearance: She is well-developed and normal weight. She is not ill-appearing.  HENT:     Head:  Normocephalic and atraumatic.     Right Ear: Tympanic membrane normal.     Left Ear: Tympanic membrane normal.     Ears:     Comments: Cerumen present in Left ear partially obscuring TM, TM appears WNL from what is visualized    Nose: Congestion and rhinorrhea present.     Mouth/Throat:     Mouth: Mucous membranes are moist. No oral lesions.     Pharynx: Posterior oropharyngeal erythema present. No oropharyngeal exudate or uvula swelling.     Comments: Evidence of post nasal drip  Eyes:     Conjunctiva/sclera: Conjunctivae normal.  Cardiovascular:     Rate and Rhythm: Normal rate and regular rhythm.     Heart sounds: No murmur. No friction rub. No gallop.   Pulmonary:     Effort: Pulmonary effort is normal. No respiratory distress.     Breath sounds: Normal breath sounds. No rales.  Abdominal:     Palpations: Abdomen is soft.     Tenderness: There is no abdominal tenderness.  Musculoskeletal:     Cervical back: Normal range of motion and neck supple.  Lymphadenopathy:     Cervical: No cervical adenopathy.  Skin:    General: Skin is warm and dry.  Neurological:     General: No focal deficit present.     Mental Status: She is alert and oriented to person, place, and time.  Psychiatric:        Mood and Affect: Mood normal.        Behavior: Behavior normal.      UC Treatments / Results  Labs (all labs ordered are listed, but only abnormal results are displayed) Labs Reviewed  CULTURE, GROUP A STREP ([place])  NOVEL CORONAVIRUS, NAA (HOSP ORDER, SEND-OUT TO REF LAB; TAT 18-24 HRS)  POCT RAPID STREP A    EKG   Radiology No results found.  Procedures Procedures (including critical care time)  Medications Ordered in UC Medications - No data to display  Initial Impression / Assessment and Plan / UC Course  I have reviewed the triage vital signs and the nursing notes.  Pertinent labs & imaging results that were available during my care of the patient were reviewed by  me and considered in my medical  decision making (see chart for details).     #Viral Pharyngitis - Rapid Strep negative, Culture sent. Exam is more indicative of viral etiology and with associate Upper respiratory symptoms. Covid PCR sent, though unlikely. Breastfeeding, recommended continued Ibuprofen use for pain and OTC cough or throat lozenge with precautions if sensing reduction in breast milk. Avoided topical benzocaine lozenges due to risk of infant exposure.   Final Clinical Impressions(s) / UC Diagnoses   Final diagnoses:  Viral pharyngitis     Discharge Instructions     We have sent a culture to evaluate if this is bacterial or not, though I do not believe so.  If the culture is positive we will call in an anti-biotic for you.  Continue symptomatic treatment with ibuprofen, warm liquids and you may utilize honey as well.  You may try an over the counter cough drop as well, Halls is considered safe while breastfeeding. If you have a reduction in milk production, stop taking the lozenge.   If your Covid-19 test is positive, you will receive a phone call from [facility] regarding your results. Negative test results are not called. Both positive and negative results area always visible on MyChart. If you do not have a MyChart account, sign up instructions are in your discharge papers.   Persons who are directed to care for themselves at home may discontinue isolation under the following conditions:  . At least 10 days have passed since symptom onset and . At least 24 hours have passed without running a fever (this means without the use of fever-reducing medications) and . Other symptoms have improved.  Persons infected with COVID-19 who never develop symptoms may discontinue isolation and other precautions 10 days after the date of their first positive COVID-19 test.     ED Prescriptions    None     PDMP not reviewed this encounter.   Purnell Shoemaker, PA-C 09/27/19 1555

## 2019-09-27 NOTE — Discharge Instructions (Addendum)
We have sent a culture to evaluate if this is bacterial or not, though I do not believe so.  If the culture is positive we will call in an anti-biotic for you.  Continue symptomatic treatment with ibuprofen, warm liquids and you may utilize honey as well.  You may try an over the counter cough drop as well, Halls is considered safe while breastfeeding. If you have a reduction in milk production, stop taking the lozenge.   If your Covid-19 test is positive, you will receive a phone call from [facility] regarding your results. Negative test results are not called. Both positive and negative results area always visible on MyChart. If you do not have a MyChart account, sign up instructions are in your discharge papers.   Persons who are directed to care for themselves at home may discontinue isolation under the following conditions:   At least 10 days have passed since symptom onset and  At least 24 hours have passed without running a fever (this means without the use of fever-reducing medications) and  Other symptoms have improved.  Persons infected with COVID-19 who never develop symptoms may discontinue isolation and other precautions 10 days after the date of their first positive COVID-19 test.

## 2019-09-27 NOTE — ED Triage Notes (Signed)
Pt states she has had a sore throat for 10 days. Pt states its not as bad now.

## 2019-09-28 LAB — NOVEL CORONAVIRUS, NAA (HOSP ORDER, SEND-OUT TO REF LAB; TAT 18-24 HRS): SARS-CoV-2, NAA: NOT DETECTED

## 2019-09-29 LAB — CULTURE, GROUP A STREP (THRC)

## 2019-12-30 ENCOUNTER — Ambulatory Visit (INDEPENDENT_AMBULATORY_CARE_PROVIDER_SITE_OTHER)
Admission: RE | Admit: 2019-12-30 | Discharge: 2019-12-30 | Disposition: A | Discharge status: Home / Self Care | Payer: BC Managed Care – PPO | Source: Ambulatory Visit

## 2019-12-30 DIAGNOSIS — R102 Pelvic and perineal pain: Secondary | ICD-10-CM

## 2019-12-30 DIAGNOSIS — R300 Dysuria: Secondary | ICD-10-CM | POA: Diagnosis not present

## 2019-12-30 DIAGNOSIS — R350 Frequency of micturition: Secondary | ICD-10-CM

## 2019-12-30 DIAGNOSIS — R8290 Unspecified abnormal findings in urine: Secondary | ICD-10-CM

## 2019-12-30 MED ORDER — NITROFURANTOIN MONOHYD MACRO 100 MG PO CAPS: 100.0000 mg | ORAL_CAPSULE | Freq: Two times a day (BID) | ORAL | 0 refills | Status: DC

## 2019-12-30 NOTE — ED Provider Notes (Signed)
Virtual Visit via Video Note:  Jennifer Randall  initiated request for Telemedicine visit with [facility] team. I connected with Jennifer Randall  on 12/30/2019 at 1:54 PM  for a synchronized telemedicine visit using a video enabled HIPPA compliant telemedicine application. I verified that I am speaking with Jennifer Randall  using two identifiers. Jennifer Eagles, Jennifer Randall  was physically located in a [facility]gent care site and Jennifer Randall was located at a different location.   The limitations of evaluation and management by telemedicine as well as the availability of in-person appointments were discussed. Patient was informed that she  may incur a bill ( including co-pay) for this virtual visit encounter. Jennifer Randall  expressed understanding and gave verbal consent to proceed with virtual visit.     History of Present Illness:Jennifer Randall  is a 29 y.o. female presents with 4 day hx of acute onset cloudy urine, malodor, now having 2 days of dysuria, hematuria. Patient is a Pharmacist, hospital, has had a difficulty urinary urgency, urinary frequency. Holds her urine regularly because of her job. Tries to hydrate well.    ROS  No current facility-administered medications for this encounter.   Current Outpatient Medications  Medication Sig Dispense Refill  . Doxylamine-Pyridoxine 10-10 MG TBEC Take 10 mg by mouth 2 (two) times daily. 30 tablet 0  . Prenatal Vit-Fe Fumarate-FA (MULTIVITAMIN-PRENATAL) 27-0.8 MG TABS tablet Take 1 tablet by mouth daily at 12 noon.        Allergies  Allergen Reactions  . Sulfa Antibiotics Hives     Past Medical History:  Diagnosis Date  . Depression   . Heart murmur     Past Surgical History:  Procedure Laterality Date  . TONSILLECTOMY    . TYMPANOSTOMY TUBE PLACEMENT        Observations/Objective: Physical Exam Constitutional:      General: She is not in acute distress.    Appearance: Normal appearance. She is well-developed.  She is not ill-appearing, toxic-appearing or diaphoretic.  Eyes:     Extraocular Movements: Extraocular movements intact.  Pulmonary:     Effort: Pulmonary effort is normal.  Neurological:     General: No focal deficit present.     Mental Status: She is alert and oriented to person, place, and time.  Psychiatric:        Mood and Affect: Mood normal.        Behavior: Behavior normal.        Thought Content: Thought content normal.        Judgment: Judgment normal.      Assessment and Plan:  1. Dysuria   2. Urinary frequency   3. Cloudy urine   4. Pelvic pain    Patient is to start Macrobid to address suspected UTI.  Counseled patient that we can do this with obtaining a urine culture but if she has any persistent or worsening symptoms, and recommended an office visit for complete evaluation including urine culture. Counseled patient on potential for adverse effects with medications prescribed/recommended today, ER and return-to-clinic precautions discussed, patient verbalized understanding.    Follow Up Instructions:    I discussed the assessment and treatment plan with the patient. The patient was provided an opportunity to ask questions and all were answered. The patient agreed with the plan and demonstrated an understanding of the instructions.   The patient was advised to call back or seek an in-person evaluation if the symptoms worsen or if  the condition fails to improve as anticipated.  I provided 10 minutes of non-face-to-face time during this encounter.    Jennifer Eagles, Jennifer Randall  12/30/2019 1:54 PM         Jennifer Eagles, Jennifer Randall 12/30/19 1405

## 2020-05-05 ENCOUNTER — Inpatient Hospital Stay (HOSPITAL_COMMUNITY)
Admission: AD | Admit: 2020-05-05 | Discharge: 2020-05-05 | Disposition: A | Discharge status: Home / Self Care | Payer: BC Managed Care – PPO | Attending: Obstetrics and Gynecology | Admitting: Obstetrics and Gynecology

## 2020-05-05 ENCOUNTER — Inpatient Hospital Stay (HOSPITAL_COMMUNITY): Payer: BC Managed Care – PPO

## 2020-05-05 ENCOUNTER — Other Ambulatory Visit: Payer: Self-pay

## 2020-05-05 ENCOUNTER — Encounter (HOSPITAL_COMMUNITY): Payer: Self-pay | Admitting: Obstetrics and Gynecology

## 2020-05-05 DIAGNOSIS — O468X1 Other antepartum hemorrhage, first trimester: Secondary | ICD-10-CM

## 2020-05-05 DIAGNOSIS — O3680X0 Pregnancy with inconclusive fetal viability, not applicable or unspecified: Secondary | ICD-10-CM

## 2020-05-05 DIAGNOSIS — R109 Unspecified abdominal pain: Secondary | ICD-10-CM | POA: Insufficient documentation

## 2020-05-05 DIAGNOSIS — Z3A01 Less than 8 weeks gestation of pregnancy: Secondary | ICD-10-CM | POA: Insufficient documentation

## 2020-05-05 DIAGNOSIS — Z79899 Other long term (current) drug therapy: Secondary | ICD-10-CM | POA: Insufficient documentation

## 2020-05-05 DIAGNOSIS — Z882 Allergy status to sulfonamides status: Secondary | ICD-10-CM | POA: Insufficient documentation

## 2020-05-05 DIAGNOSIS — O26891 Other specified pregnancy related conditions, first trimester: Secondary | ICD-10-CM | POA: Insufficient documentation

## 2020-05-05 DIAGNOSIS — O209 Hemorrhage in early pregnancy, unspecified: Secondary | ICD-10-CM

## 2020-05-05 DIAGNOSIS — IMO0001 Reserved for inherently not codable concepts without codable children: Secondary | ICD-10-CM

## 2020-05-05 DIAGNOSIS — O368390 Maternal care for abnormalities of the fetal heart rate or rhythm, unspecified trimester, not applicable or unspecified: Secondary | ICD-10-CM

## 2020-05-05 DIAGNOSIS — O208 Other hemorrhage in early pregnancy: Secondary | ICD-10-CM | POA: Diagnosis present

## 2020-05-05 DIAGNOSIS — O368310 Maternal care for abnormalities of the fetal heart rate or rhythm, first trimester, not applicable or unspecified: Secondary | ICD-10-CM | POA: Diagnosis not present

## 2020-05-05 DIAGNOSIS — O418X10 Other specified disorders of amniotic fluid and membranes, first trimester, not applicable or unspecified: Secondary | ICD-10-CM

## 2020-05-05 LAB — CBC
HCT: 40.7 % (ref 36.0–46.0)
Hemoglobin: 13.3 g/dL (ref 12.0–15.0)
MCH: 29 pg (ref 26.0–34.0)
MCHC: 32.7 g/dL (ref 30.0–36.0)
MCV: 88.7 fL (ref 80.0–100.0)
Platelets: 304 10*3/uL (ref 150–400)
RBC: 4.59 MIL/uL (ref 3.87–5.11)
RDW: 12.4 % (ref 11.5–15.5)
WBC: 11 10*3/uL — ABNORMAL HIGH (ref 4.0–10.5)
nRBC: 0 % (ref 0.0–0.2)

## 2020-05-05 LAB — WET PREP, GENITAL
Clue Cells Wet Prep HPF POC: NONE SEEN
Sperm: NONE SEEN
Trich, Wet Prep: NONE SEEN
Yeast Wet Prep HPF POC: NONE SEEN

## 2020-05-05 LAB — COMPREHENSIVE METABOLIC PANEL
ALT: 11 U/L (ref 0–44)
AST: 11 U/L — ABNORMAL LOW (ref 15–41)
Albumin: 3.9 g/dL (ref 3.5–5.0)
Alkaline Phosphatase: 48 U/L (ref 38–126)
Anion gap: 10 (ref 5–15)
BUN: 6 mg/dL (ref 6–20)
CO2: 26 mmol/L (ref 22–32)
Calcium: 9 mg/dL (ref 8.9–10.3)
Chloride: 102 mmol/L (ref 98–111)
Creatinine, Ser: 0.6 mg/dL (ref 0.44–1.00)
GFR calc Af Amer: >60 mL/min (ref >60)
GFR calc non Af Amer: >60 mL/min (ref >60)
Glucose, Bld: 94 mg/dL (ref 70–99)
Potassium: 3.4 mmol/L — ABNORMAL LOW (ref 3.5–5.1)
Sodium: 138 mmol/L (ref 135–145)
Total Bilirubin: 0.6 mg/dL (ref 0.3–1.2)
Total Protein: 6.3 g/dL — ABNORMAL LOW (ref 6.5–8.1)

## 2020-05-05 LAB — POCT PREGNANCY, URINE: Preg Test, Ur: POSITIVE — AB

## 2020-05-05 LAB — URINALYSIS, ROUTINE W REFLEX MICROSCOPIC
Bacteria, UA: NONE SEEN
Bilirubin Urine: NEGATIVE
Glucose, UA: NEGATIVE mg/dL
Ketones, ur: NEGATIVE mg/dL
Leukocytes,Ua: NEGATIVE
Nitrite: NEGATIVE
Protein, ur: NEGATIVE mg/dL
Specific Gravity, Urine: 1.009 (ref 1.005–1.030)
pH: 6 (ref 5.0–8.0)

## 2020-05-05 LAB — HCG, QUANTITATIVE, PREGNANCY: hCG, Beta Chain, Quant, S: 3624 m[IU]/mL — ABNORMAL HIGH (ref <5)

## 2020-05-05 LAB — TYPE AND SCREEN
ABO/RH(D): O POS
Antibody Screen: NEGATIVE

## 2020-05-05 NOTE — MAU Provider Note (Signed)
History     [id number]  Arrival date and time: 05/05/20 1648    Chief Complaint  Patient presents with  . Vaginal Bleeding  . Abdominal Pain     HPI Jennifer Randall is a 29 y.o. at [redacted]w[redacted]d by LMP with no significant PMHx, who presents for abdominal cramping and vaginal bleeding that began this morning. Cramping in lower abdomen, intermittent in nature. Not severe. Has not taken medication. Vaginal spotting also started this morning, has used panty liner, not saturating. Not on contraception. No abnormal vaginal discharge or odor. No fever/chills. No n/v/d/c. No dysuria, hematuria, urgency, frequency.   --/--/O POS (07/29 1753)  OB History    Gravida  3   Para  2   Term  2   Preterm  0   AB  0   Living  2     SAB  0   TAB  0   Ectopic  0   Multiple  0   Live Births  2           Past Medical History:  Diagnosis Date  . Depression   . Heart murmur     Past Surgical History:  Procedure Laterality Date  . TONSILLECTOMY    . TYMPANOSTOMY TUBE PLACEMENT      Family History  Problem Relation Age of Onset  . Cancer Father   . Hyperlipidemia Maternal Grandmother     Social History   Socioeconomic History  . Marital status: Married    Spouse name: Not on file  . Number of children: Not on file  . Years of education: Not on file  . Highest education level: Not on file  Occupational History  . Not on file  Tobacco Use  . Smoking status: Never Smoker  . Smokeless tobacco: Never Used  Vaping Use  . Vaping Use: Never used  Substance and Sexual Activity  . Alcohol use: Not Currently    Alcohol/week: 0.0 standard drinks  . Drug use: No  . Sexual activity: Yes    Comment: No protection, fiancee  Other Topics Concern  . Not on file  Social History Narrative  . Not on file   Social Determinants of Health   Financial Resource Strain:   . Difficulty of Paying Living Expenses:   Food Insecurity:   . Worried About Charity fundraiser in the Last  Year:   . Arboriculturist in the Last Year:   Transportation Needs:   . Film/video editor (Medical):   Marland Kitchen Lack of Transportation (Non-Medical):   Physical Activity:   . Days of Exercise per Week:   . Minutes of Exercise per Session:   Stress:   . Feeling of Stress :   Social Connections:   . Frequency of Communication with Friends and Family:   . Frequency of Social Gatherings with Friends and Family:   . Attends Religious Services:   . Active Member of Clubs or Organizations:   . Attends Archivist Meetings:   Marland Kitchen Marital Status:   Intimate Partner Violence:   . Fear of Current or Ex-Partner:   . Emotionally Abused:   Marland Kitchen Physically Abused:   . Sexually Abused:     Allergies  Allergen Reactions  . Sulfa Antibiotics Hives    No current facility-administered medications on file prior to encounter.   Current Outpatient Medications on File Prior to Encounter  Medication Sig Dispense Refill  . Prenatal Vit-Fe Fumarate-FA (MULTIVITAMIN-PRENATAL) 27-0.8 MG TABS tablet  Take 1 tablet by mouth daily at 12 noon.     . Doxylamine-Pyridoxine 10-10 MG TBEC Take 10 mg by mouth 2 (two) times daily. 30 tablet 0  . nitrofurantoin, macrocrystal-monohydrate, (MACROBID) 100 MG capsule Take 1 capsule (100 mg total) by mouth 2 (two) times daily. 10 capsule 0     ROS Pertinent positives and negative per HPI, all others reviewed and negative  Physical Exam   BP 107/67   Pulse 79   Temp 98.6 F (37 C)   Resp 18   Ht 5\' 2"  (1.575 m)   Wt 55.3 kg   LMP 03/17/2020   SpO2 100%   BMI 22.31 kg/m   Physical Exam Vitals and nursing note reviewed. Exam conducted with a chaperone present.  Constitutional:      General: She is not in acute distress.    Appearance: Normal appearance. She is normal weight.  HENT:     Head: Normocephalic and atraumatic.     Nose: Nose normal.     Mouth/Throat:     Mouth: Mucous membranes are moist.     Pharynx: Oropharynx is clear.  Eyes:      Extraocular Movements: Extraocular movements intact.     Conjunctiva/sclera: Conjunctivae normal.  Cardiovascular:     Rate and Rhythm: Normal rate.     Pulses: Normal pulses.  Pulmonary:     Effort: Pulmonary effort is normal.  Genitourinary:    Vagina: Normal.     Cervix: Normal.     Comments: Dark brown blood in vaginal vault, no bleeding from closed cervical os. Musculoskeletal:        General: Normal range of motion.     Cervical back: Normal range of motion and neck supple.  Skin:    General: Skin is warm and dry.  Neurological:     General: No focal deficit present.     Mental Status: She is alert and oriented to person, place, and time. Mental status is at baseline.  Psychiatric:        Mood and Affect: Mood normal.        Behavior: Behavior normal.     Cervical Exam   as noted above  Bedside Ultrasound Formal TVUS ordered  My interpretation: n/a  FHT Not performed due to [place] Results for orders placed or performed during the hospital encounter of 05/05/20 (from the past 24 hour(s))  Pregnancy, urine POC     Status: Abnormal   Collection Time: 05/05/20  5:08 PM  Result Value Ref Range   Preg Test, Ur POSITIVE (A) NEGATIVE  Urinalysis, Routine w reflex microscopic     Status: Abnormal   Collection Time: 05/05/20  5:09 PM  Result Value Ref Range   Color, Urine YELLOW YELLOW   APPearance CLEAR CLEAR   Specific Gravity, Urine 1.009 1.005 - 1.030   pH 6.0 5.0 - 8.0   Glucose, UA NEGATIVE NEGATIVE mg/dL   Hgb urine dipstick MODERATE (A) NEGATIVE   Bilirubin Urine NEGATIVE NEGATIVE   Ketones, ur NEGATIVE NEGATIVE mg/dL   Protein, ur NEGATIVE NEGATIVE mg/dL   Nitrite NEGATIVE NEGATIVE   Leukocytes,Ua NEGATIVE NEGATIVE   RBC / HPF 0-5 0 - 5 RBC/hpf   WBC, UA 0-5 0 - 5 WBC/hpf   Bacteria, UA NONE SEEN NONE SEEN   Squamous Epithelial / LPF 0-5 0 - 5   Mucus PRESENT   CBC     Status: Abnormal   Collection Time: 05/05/20  5:53 PM  Result  Value Ref Range    WBC 11.0 (H) 4.0 - 10.5 K/uL   RBC 4.59 3.87 - 5.11 MIL/uL   Hemoglobin 13.3 12.0 - 15.0 g/dL   HCT 40.7 36 - 46 %   MCV 88.7 80.0 - 100.0 fL   MCH 29.0 26.0 - 34.0 pg   MCHC 32.7 30.0 - 36.0 g/dL   RDW 12.4 11.5 - 15.5 %   Platelets 304 150 - 400 K/uL   nRBC 0.0 0.0 - 0.2 %  Comprehensive metabolic panel     Status: Abnormal   Collection Time: 05/05/20  5:53 PM  Result Value Ref Range   Sodium 138 135 - 145 mmol/L   Potassium 3.4 (L) 3.5 - 5.1 mmol/L   Chloride 102 98 - 111 mmol/L   CO2 26 22 - 32 mmol/L   Glucose, Bld 94 70 - 99 mg/dL   BUN 6 6 - 20 mg/dL   Creatinine, Ser 0.60 0.44 - 1.00 mg/dL   Calcium 9.0 8.9 - 10.3 mg/dL   Total Protein 6.3 (L) 6.5 - 8.1 g/dL   Albumin 3.9 3.5 - 5.0 g/dL   AST 11 (L) 15 - 41 U/L   ALT 11 0 - 44 U/L   Alkaline Phosphatase 48 38 - 126 U/L   Total Bilirubin 0.6 0.3 - 1.2 mg/dL   GFR calc non Af Amer >60 >60 mL/min   GFR calc Af Amer >60 >60 mL/min   Anion gap 10 5 - 15  Type and screen     Status: None   Collection Time: 05/05/20  5:53 PM  Result Value Ref Range   ABO/RH(D) O POS    Antibody Screen NEG    Sample Expiration      05/08/2020,2359 Performed at [facility], [address], [place], [place] [address]   hCG, quantitative, pregnancy     Status: Abnormal   Collection Time: 05/05/20  5:53 PM  Result Value Ref Range   hCG, Beta Chain, Quant, S 3,624 (H) <5 mIU/mL  Wet prep, genital     Status: Abnormal   Collection Time: 05/05/20  6:04 PM  Result Value Ref Range   Yeast Wet Prep HPF POC NONE SEEN NONE SEEN   Trich, Wet Prep NONE SEEN NONE SEEN   Clue Cells Wet Prep HPF POC NONE SEEN NONE SEEN   WBC, Wet Prep HPF POC MANY (A) NONE SEEN   Sperm NONE SEEN     Imaging US OB LESS THAN 14 WEEKS WITH OB TRANSVAGINAL  Result Date: 05/05/2020 CLINICAL DATA:  Pregnant patient in early trimester pregnancy with vaginal bleeding and pregnancy of unknown location. EXAM: OBSTETRIC <14 WK [place] AND TRANSVAGINAL OB US  TECHNIQUE: Both transabdominal and transvaginal ultrasound examinations were performed for complete evaluation of the gestation as well as the maternal uterus, adnexal regions, and pelvic cul-de-sac. Transvaginal technique was performed to assess early pregnancy. COMPARISON:  None. FINDINGS: Intrauterine gestational sac: Single Yolk sac:  Visualized. Embryo:  Visualized. Cardiac Activity: Visualized. Heart Rate: 57 bpm CRL:  3.7 mm   6 w   0 d                  [place] EDC: 12/29/2020 Subchorionic hemorrhage: Small measuring 1.9 x 1.5 x 0.4 cm superior to the gestational sac. Maternal uterus/adnexae: Single live intrauterine pregnancy. Small subchorionic hemorrhage. Both ovaries are visualized. The right ovary is normal. There is a physiologic corpus luteal cyst in the left ovary. No extra ovarian adnexal  mass. Trace pelvic free fluid. IMPRESSION: 1. Single live intrauterine pregnancy estimated gestational age [redacted] weeks 0 days based on crown-rump length for ultrasound [facility] 12/29/2020. 2. Fetal bradycardia which may be due to early gestational age. Recommend close clinical follow-up. 3. Small subchorionic hemorrhage. Electronically Signed   By: Keith Rake M.D.   On: 05/05/2020 18:53    MAU Course  Procedures  Lab Orders     Wet prep, genital     Urinalysis, Routine w reflex microscopic     CBC     Comprehensive metabolic panel     hCG, quantitative, pregnancy     Pregnancy, urine POC No orders of the defined types were placed in this encounter.   Imaging Orders     US OB LESS THAN 14 WEEKS WITH OB TRANSVAGINAL  MDM moderate  Assessment and Plan  29 yo G3P2002 at [redacted]w[redacted]d by LMP presents for vaginal bleeding and cramping starting today.  #Subchorionic hemorrhage #Vaginal bleeding first trimsetr #Fetal bradycardia Patient presents with symptoms since this morning. VSS. Lab work unremarkable. TVUS demonstrates small subchorionic hemorrhage, likely etiology of patient's bleeding. IUP noted. HCG 3624.  Of note, fetal bradycardia also noted on ultrasound, could be normal for gestational age. -Recommend follow up with OBGYN, patient will call office tomorrow to schedule appt -Repeat hcg 7/31 -Pelvic rest recommended -Strict return precautions provided  Roxanne Gates, MD OB Fellow, [place] [facility] [place] 05/05/2020 7:52 PM

## 2020-05-05 NOTE — Discharge Instructions (Signed)
-continue prenatal vitamin -pelvic rest: nothing in the vagina -fall precautions- no activities where you could have a fall  -call obgyn tomorrow and schedule appt for evaluation, they may want a repeat hcg (pregnancy hormone) Saturday, return to MAU for re-eval if so  Vaginal Bleeding During Pregnancy, First Trimester  A small amount of bleeding (spotting) from the vagina is common during early pregnancy. Sometimes the bleeding is normal and does not cause problems. At other times, though, bleeding may be a sign of something serious. Tell your doctor about any bleeding from your vagina right away. Follow these instructions at home: Activity  Follow your doctor's instructions about how active you can be.  If needed, make plans for someone to help with your normal activities.  Do not have sex or orgasms until your doctor says that this is safe. General instructions  Take over-the-counter and prescription medicines only as told by your doctor.  Watch your condition for any changes.  Write down: ? The number of pads you use each day. ? How often you change pads. ? How soaked (saturated) your pads are.  Do not use tampons.  Do not douche.  If you pass any tissue from your vagina, save it to show to your doctor.  Keep all follow-up visits as told by your doctor. This is important. Contact a doctor if:  You have vaginal bleeding at any time while you are pregnant.  You have cramps.  You have a fever. Get help right away if:  You have very bad cramps in your back or belly (abdomen).  You pass large clots or a lot of tissue from your vagina.  Your bleeding gets worse.  You feel light-headed.  You feel weak.  You pass out (faint).  You have chills.  You are leaking fluid from your vagina.  You have a gush of fluid from your vagina. Summary  Sometimes vaginal bleeding during pregnancy is normal and does not cause problems. At other times, bleeding may be a sign of  something serious.  Tell your doctor about any bleeding from your vagina right away.  Follow your doctor's instructions about how active you can be. You may need someone to help you with your normal activities. This information is not intended to replace advice given to you by your health care provider. Make sure you discuss any questions you have with your health care provider. Document Revised: 01/13/2019 Document Reviewed: 12/26/2016 Elsevier Patient Education  [place] Hematoma  A subchorionic hematoma is a gathering of blood between the outer wall of the embryo (chorion) and the inner wall of the womb (uterus). This condition can cause vaginal bleeding. If they cause little or no vaginal bleeding, early small hematomas usually shrink on their own and do not affect your baby or pregnancy. When bleeding starts later in pregnancy, or if the hematoma is larger or occurs in older pregnant women, the condition may be more serious. Larger hematomas may get bigger, which increases the chances of miscarriage. This condition also increases the risk of:  Premature separation of the placenta from the uterus.  Premature (preterm) labor.  Stillbirth. What are the causes? The exact cause of this condition is not known. It occurs when blood is trapped between the placenta and the uterine wall because the placenta has separated from the original site of implantation. What increases the risk? You are more likely to develop this condition if:  You were treated with fertility medicines.  You conceived  through in vitro fertilization (IVF). What are the signs or symptoms? Symptoms of this condition include:  Vaginal spotting or bleeding.  Contractions of the uterus. These cause abdominal pain. Sometimes you may have no symptoms and the bleeding may only be seen when ultrasound images are taken (transvaginal ultrasound). How is this diagnosed? This condition is diagnosed  based on a physical exam. This includes a pelvic exam. You may also have other tests, including:  Blood tests.  Urine tests.  Ultrasound of the abdomen. How is this treated? Treatment for this condition can vary. Treatment may include:  Watchful waiting. You will be monitored closely for any changes in bleeding. During this stage: ? The hematoma may be reabsorbed by the body. ? The hematoma may separate the fluid-filled space containing the embryo (gestational sac) from the wall of the womb (endometrium).  Medicines.  Activity restriction. This may be needed until the bleeding stops. Follow these instructions at home:  Stay on bed rest if told to do so by your health care provider.  Do not lift anything that is heavier than 10 lbs. (4.5 kg) or as told by your health care provider.  Do not use any products that contain nicotine or tobacco, such as cigarettes and e-cigarettes. If you need help quitting, ask your health care provider.  Track and write down the number of pads you use each day and how soaked (saturated) they are.  Do not use tampons.  Keep all follow-up visits as told by your health care provider. This is important. Your health care provider may ask you to have follow-up blood tests or ultrasound tests or both. Contact a health care provider if:  You have any vaginal bleeding.  You have a fever. Get help right away if:  You have severe cramps in your stomach, back, abdomen, or pelvis.  You pass large clots or tissue. Save any tissue for your health care provider to look at.  You have more vaginal bleeding, and you faint or become lightheaded or weak. Summary  A subchorionic hematoma is a gathering of blood between the outer wall of the placenta and the uterus.  This condition can cause vaginal bleeding.  Sometimes you may have no symptoms and the bleeding may only be seen when ultrasound images are taken.  Treatment may include watchful waiting,  medicines, or activity restriction. This information is not intended to replace advice given to you by your health care provider. Make sure you discuss any questions you have with your health care provider. Document Revised: 09/06/2017 Document Reviewed: 11/20/2016 Elsevier Patient Education  2020 [employer].

## 2020-05-05 NOTE — MAU Note (Signed)
.   Jennifer Randall is a 29 y.o. at [redacted]w[redacted]d here in MAU reporting: she has had mild abdominal cramping and started having small amount of vaginal bleeding this morning,  LMP: 03/17/20 Onset of complaint: today Pain score: 2 Vitals:   05/05/20 1711  BP: (!) 106/44  Pulse: 88  Resp: 18  Temp: 98.6 F (37 C)  SpO2: 100%     FHT: Lab orders placed from triage: UA/UPT

## 2020-05-06 LAB — GC/CHLAMYDIA PROBE AMP (~~LOC~~) NOT AT ARMC
Chlamydia: NEGATIVE
Comment: NEGATIVE
Comment: NORMAL
Neisseria Gonorrhea: NEGATIVE

## 2020-05-07 ENCOUNTER — Inpatient Hospital Stay (HOSPITAL_COMMUNITY)
Admission: AD | Admit: 2020-05-07 | Discharge: 2020-05-07 | Disposition: A | Discharge status: Home / Self Care | Payer: BC Managed Care – PPO | Attending: Obstetrics and Gynecology | Admitting: Obstetrics and Gynecology

## 2020-05-07 ENCOUNTER — Other Ambulatory Visit: Payer: Self-pay

## 2020-05-07 DIAGNOSIS — O039 Complete or unspecified spontaneous abortion without complication: Secondary | ICD-10-CM | POA: Insufficient documentation

## 2020-05-07 DIAGNOSIS — Z3A01 Less than 8 weeks gestation of pregnancy: Secondary | ICD-10-CM | POA: Diagnosis not present

## 2020-05-07 LAB — HCG, QUANTITATIVE, PREGNANCY: hCG, Beta Chain, Quant, S: 1025 m[IU]/mL — ABNORMAL HIGH (ref <5)

## 2020-05-07 NOTE — Discharge Instructions (Signed)

## 2020-05-07 NOTE — MAU Provider Note (Signed)
History   Chief Complaint:  Follow-up  Jennifer Randall is  29 y.o. [id number] Patient's last menstrual period was 03/17/2020.Marland Kitchen Patient is here for follow up of quantitative HCG and ongoing surveillance of pregnancy status. She is [redacted]w[redacted]d weeks gestation  by LMP.    Since her last visit, the patient is without new complaint. The patient reports bleeding as  similar to period.  She denies any pain.  General ROS:  positive for vaginal bleeding  Her previous Quantitative HCG values are:  Results for Jennifer, Randall (MRN [id number]) as of 05/07/2020 13:23  Ref. Range 05/05/2020 17:53  HCG, Beta Chain, Quant, S Latest Ref Range: <5 mIU/mL 3,624 (H)   Physical Exam   Blood pressure (!) 100/60, pulse 79, temperature 98.6 F (37 C), temperature source Oral, resp. rate 16, last menstrual period 03/17/2020, SpO2 100 %, currently breastfeeding.  Focused Gynecological Exam: examination not indicated  Labs: Results for orders placed or performed during the hospital encounter of 05/07/20 (from the past 24 hour(s))  hCG, quantitative, pregnancy   Collection Time: 05/07/20 12:12 PM  Result Value Ref Range   hCG, Beta Chain, Quant, S 1,025 (H) <5 mIU/mL    Assessment:   1. Miscarriage    Plan: -Discharge home in stable condition -Vaginal bleeding and pain precautions discussed -Patient advised to follow-up with [facility] as scheduled this week  -Patient may return to MAU as needed or if her condition were to change or worsen  Wende Mott, CNM 05/07/2020, 1:16 PM

## 2020-05-07 NOTE — MAU Note (Signed)
Jennifer Randall is a 29 y.o. at [redacted]w[redacted]d here in MAU reporting: here for follow up hcg. States she has been having some pain and bleeding, bleeding got heavier when she left MAU the other day but has since tapered off. Is not having pain at this time, was cramping yesterday during the day. Passed a clot/tissure last night, pt brought it with her in a Ziploc bag.   Pain score: 0/10  Vitals:   05/07/20 1212  BP: (!) 100/60  Pulse: 79  Resp: 16  Temp: 98.6 F (37 C)  SpO2: 100%     Lab orders placed from triage: hcg

## 2020-10-17 ENCOUNTER — Encounter (HOSPITAL_COMMUNITY): Payer: Self-pay

## 2020-10-17 ENCOUNTER — Other Ambulatory Visit: Payer: Self-pay | Admitting: Student

## 2020-10-17 ENCOUNTER — Other Ambulatory Visit: Payer: Self-pay

## 2020-10-17 ENCOUNTER — Emergency Department (HOSPITAL_COMMUNITY): Payer: BC Managed Care – PPO

## 2020-10-17 ENCOUNTER — Inpatient Hospital Stay (HOSPITAL_COMMUNITY)
Admission: EM | Admit: 2020-10-17 | Discharge: 2020-10-21 | DRG: 871 | Disposition: A | Discharge status: Home / Self Care | Payer: BC Managed Care – PPO | Attending: Internal Medicine | Admitting: Internal Medicine

## 2020-10-17 ENCOUNTER — Ambulatory Visit (HOSPITAL_COMMUNITY)
Admission: EM | Admit: 2020-10-17 | Discharge: 2020-10-17 | Disposition: A | Discharge status: Home / Self Care | Payer: BC Managed Care – PPO | Attending: Emergency Medicine | Admitting: Emergency Medicine

## 2020-10-17 DIAGNOSIS — N2881 Hypertrophy of kidney: Secondary | ICD-10-CM | POA: Diagnosis present

## 2020-10-17 DIAGNOSIS — R509 Fever, unspecified: Secondary | ICD-10-CM

## 2020-10-17 DIAGNOSIS — R109 Unspecified abdominal pain: Secondary | ICD-10-CM

## 2020-10-17 DIAGNOSIS — R102 Pelvic and perineal pain: Secondary | ICD-10-CM

## 2020-10-17 DIAGNOSIS — F32A Depression, unspecified: Secondary | ICD-10-CM | POA: Diagnosis present

## 2020-10-17 DIAGNOSIS — A419 Sepsis, unspecified organism: Secondary | ICD-10-CM | POA: Diagnosis not present

## 2020-10-17 DIAGNOSIS — B373 Candidiasis of vulva and vagina: Secondary | ICD-10-CM | POA: Diagnosis present

## 2020-10-17 DIAGNOSIS — R1011 Right upper quadrant pain: Secondary | ICD-10-CM | POA: Diagnosis not present

## 2020-10-17 DIAGNOSIS — N739 Female pelvic inflammatory disease, unspecified: Secondary | ICD-10-CM

## 2020-10-17 DIAGNOSIS — Z20822 Contact with and (suspected) exposure to covid-19: Secondary | ICD-10-CM | POA: Diagnosis present

## 2020-10-17 DIAGNOSIS — Z882 Allergy status to sulfonamides status: Secondary | ICD-10-CM

## 2020-10-17 DIAGNOSIS — E876 Hypokalemia: Secondary | ICD-10-CM | POA: Diagnosis present

## 2020-10-17 DIAGNOSIS — R6521 Severe sepsis with septic shock: Secondary | ICD-10-CM | POA: Diagnosis present

## 2020-10-17 DIAGNOSIS — N898 Other specified noninflammatory disorders of vagina: Secondary | ICD-10-CM | POA: Diagnosis present

## 2020-10-17 DIAGNOSIS — Z87440 Personal history of urinary (tract) infections: Secondary | ICD-10-CM

## 2020-10-17 DIAGNOSIS — Z83438 Family history of other disorder of lipoprotein metabolism and other lipidemia: Secondary | ICD-10-CM

## 2020-10-17 DIAGNOSIS — Z809 Family history of malignant neoplasm, unspecified: Secondary | ICD-10-CM

## 2020-10-17 DIAGNOSIS — E162 Hypoglycemia, unspecified: Secondary | ICD-10-CM | POA: Diagnosis present

## 2020-10-17 DIAGNOSIS — N10 Acute tubulo-interstitial nephritis: Secondary | ICD-10-CM | POA: Diagnosis present

## 2020-10-17 DIAGNOSIS — R011 Cardiac murmur, unspecified: Secondary | ICD-10-CM | POA: Diagnosis present

## 2020-10-17 LAB — COMPREHENSIVE METABOLIC PANEL
ALT: 14 U/L (ref 0–44)
AST: 16 U/L (ref 15–41)
Albumin: 3.7 g/dL (ref 3.5–5.0)
Alkaline Phosphatase: 55 U/L (ref 38–126)
Anion gap: 11 (ref 5–15)
BUN: 8 mg/dL (ref 6–20)
CO2: 22 mmol/L (ref 22–32)
Calcium: 8.7 mg/dL — ABNORMAL LOW (ref 8.9–10.3)
Chloride: 98 mmol/L (ref 98–111)
Creatinine, Ser: 0.78 mg/dL (ref 0.44–1.00)
GFR, Estimated: >60 mL/min (ref >60)
Glucose, Bld: 150 mg/dL — ABNORMAL HIGH (ref 70–99)
Potassium: 3.2 mmol/L — ABNORMAL LOW (ref 3.5–5.1)
Sodium: 131 mmol/L — ABNORMAL LOW (ref 135–145)
Total Bilirubin: 0.9 mg/dL (ref 0.3–1.2)
Total Protein: 6.8 g/dL (ref 6.5–8.1)

## 2020-10-17 LAB — WET PREP, GENITAL
Clue Cells Wet Prep HPF POC: NONE SEEN
Sperm: NONE SEEN
Trich, Wet Prep: NONE SEEN

## 2020-10-17 LAB — CBC
HCT: 40.5 % (ref 36.0–46.0)
Hemoglobin: 13.1 g/dL (ref 12.0–15.0)
MCH: 29.1 pg (ref 26.0–34.0)
MCHC: 32.3 g/dL (ref 30.0–36.0)
MCV: 90 fL (ref 80.0–100.0)
Platelets: 279 10*3/uL (ref 150–400)
RBC: 4.5 MIL/uL (ref 3.87–5.11)
RDW: 12 % (ref 11.5–15.5)
WBC: 21.7 10*3/uL — ABNORMAL HIGH (ref 4.0–10.5)
nRBC: 0 % (ref 0.0–0.2)

## 2020-10-17 LAB — URINALYSIS, ROUTINE W REFLEX MICROSCOPIC
Bilirubin Urine: NEGATIVE
Glucose, UA: NEGATIVE mg/dL
Ketones, ur: 20 mg/dL — AB
Nitrite: NEGATIVE
Protein, ur: 30 mg/dL — AB
Specific Gravity, Urine: 1.017 (ref 1.005–1.030)
pH: 5 (ref 5.0–8.0)

## 2020-10-17 LAB — LIPASE, BLOOD: Lipase: 21 U/L (ref 11–51)

## 2020-10-17 LAB — PREGNANCY, URINE: Preg Test, Ur: NEGATIVE

## 2020-10-17 LAB — LACTIC ACID, PLASMA: Lactic Acid, Venous: 1 mmol/L (ref 0.5–1.9)

## 2020-10-17 MED ORDER — ACETAMINOPHEN 325 MG PO TABS
650.0000 mg | ORAL_TABLET | Freq: Once | ORAL | Status: AC
  Administered 2020-10-17: 650 mg via ORAL
  Filled 2020-10-17: qty 2

## 2020-10-17 MED ORDER — SODIUM CHLORIDE 0.9 % IV BOLUS
1000.0000 mL | Freq: Once | INTRAVENOUS | Status: AC
  Administered 2020-10-17: 1000 mL via INTRAVENOUS

## 2020-10-17 MED ORDER — ONDANSETRON HCL 4 MG/2ML IJ SOLN
4.0000 mg | Freq: Once | INTRAMUSCULAR | Status: AC
  Administered 2020-10-17: 4 mg via INTRAVENOUS
  Filled 2020-10-17: qty 2

## 2020-10-17 MED ORDER — IOHEXOL 300 MG/ML  SOLN
100.0000 mL | Freq: Once | INTRAMUSCULAR | Status: AC | PRN
  Administered 2020-10-17: 100 mL via INTRAVENOUS

## 2020-10-17 MED ORDER — MORPHINE SULFATE (PF) 2 MG/ML IV SOLN
2.0000 mg | Freq: Once | INTRAVENOUS | Status: AC
  Administered 2020-10-17: 2 mg via INTRAVENOUS
  Filled 2020-10-17: qty 1

## 2020-10-17 MED ORDER — SODIUM CHLORIDE 0.9 % IV SOLN
1.0000 g | Freq: Once | INTRAVENOUS | Status: AC
  Administered 2020-10-17: 1 g via INTRAVENOUS
  Filled 2020-10-17: qty 10

## 2020-10-17 MED ORDER — ACETAMINOPHEN 325 MG PO TABS
ORAL_TABLET | ORAL | Status: AC
  Filled 2020-10-17: qty 3

## 2020-10-17 MED ORDER — ACETAMINOPHEN 325 MG PO TABS
975.0000 mg | ORAL_TABLET | Freq: Once | ORAL | Status: AC
  Administered 2020-10-17: 975 mg via ORAL

## 2020-10-17 NOTE — ED Triage Notes (Signed)
Pt reports lower back pain that started a couple days ago along with foul smelling urine. Chills and fevers. Pt sent from UC for further evaluation. Denies vaginal bleeding but is having vaginal discharge since having a miscarriage in July.

## 2020-10-17 NOTE — ED Provider Notes (Signed)
[place]    CSN: [id number] Arrival date & time: 10/17/20  1132      History   Chief Complaint Chief Complaint  Patient presents with  . Flank Pain    HPI Jennifer Randall is a 30 y.o. female.   Patient presents with right flank pain and right upper abdominal pain x2 days; worse today.  She also reports headache, shaking, nausea, vaginal discharge.  She denies dysuria, vomiting, diarrhea, or other symptoms.  The history is provided by the patient.    Past Medical History:  Diagnosis Date  . Depression   . Heart murmur     There are no problems to display for this patient.   Past Surgical History:  Procedure Laterality Date  . TONSILLECTOMY    . TYMPANOSTOMY TUBE PLACEMENT      OB History    Gravida  3   Para  2   Term  2   Preterm  0   AB  0   Living  2     SAB  0   IAB  0   Ectopic  0   Multiple  0   Live Births  2            Home Medications    Prior to Admission medications   Medication Sig Start Date End Date Taking? Authorizing Provider  Prenatal Vit-Fe Fumarate-FA (MULTIVITAMIN-PRENATAL) 27-0.8 MG TABS tablet Take 1 tablet by mouth daily at 12 noon.     [provider]    Family History Family History  Problem Relation Age of Onset  . Cancer Father   . Hyperlipidemia Maternal Grandmother     Social History Social History   Tobacco Use  . Smoking status: Never Smoker  . Smokeless tobacco: Never Used  Vaping Use  . Vaping Use: Never used  Substance Use Topics  . Alcohol use: Not Currently    Alcohol/week: 0.0 standard drinks  . Drug use: No     Allergies   Sulfa antibiotics   Review of Systems Review of Systems  Constitutional: Positive for fever. Negative for chills.  HENT: Negative for ear pain and sore throat.   Eyes: Negative for pain and visual disturbance.  Respiratory: Negative for cough and shortness of breath.   Cardiovascular: Negative for chest pain and palpitations.   Gastrointestinal: Positive for abdominal pain. Negative for constipation, diarrhea and vomiting.  Genitourinary: Positive for flank pain and vaginal discharge. Negative for dysuria, hematuria and pelvic pain.  Musculoskeletal: Negative for arthralgias and back pain.  Skin: Negative for color change and rash.  Neurological: Negative for seizures and syncope.  All other systems reviewed and are negative.    Physical Exam Triage Vital Signs ED Triage Vitals [10/17/20 1410]  Enc Vitals Group     BP      Pulse      Resp      Temp      Temp src      SpO2      Weight      Height      Head Circumference      Peak Flow      Pain Score 7     Pain Loc      Pain Edu?      Excl. in [place]?    No data found.  Updated Vital Signs BP 109/70   Pulse (!) 135   Temp (!) 103.2 F (39.6 C)   Resp (!) 22  LMP 09/28/2020   SpO2 97%   Breastfeeding Unknown   Visual Acuity Right Eye Distance:   Left Eye Distance:   Bilateral Distance:    Right Eye Near:   Left Eye Near:    Bilateral Near:     Physical Exam Vitals and nursing note reviewed.  Constitutional:      General: She is not in acute distress.    Appearance: She is well-developed and well-nourished. She is ill-appearing.  HENT:     Head: Normocephalic and atraumatic.  Eyes:     Conjunctiva/sclera: Conjunctivae normal.  Cardiovascular:     Rate and Rhythm: Regular rhythm. Tachycardia present.     Heart sounds: Normal heart sounds.  Pulmonary:     Effort: Pulmonary effort is normal. No respiratory distress.     Breath sounds: Normal breath sounds.  Abdominal:     General: Bowel sounds are normal.     Palpations: Abdomen is soft.     Tenderness: There is abdominal tenderness in the right upper quadrant. There is right CVA tenderness. There is no left CVA tenderness, guarding or rebound.  Musculoskeletal:        General: No edema.     Cervical back: Neck supple.  Skin:    General: Skin is warm and dry.  Neurological:      General: No focal deficit present.     Mental Status: She is alert and oriented to person, place, and time.  Psychiatric:        Mood and Affect: Mood and affect and mood normal.        Behavior: Behavior normal.      UC Treatments / Results  Labs (all labs ordered are listed, but only abnormal results are displayed) Labs Reviewed - No data to display  EKG   Radiology No results found.  Procedures Procedures (including critical care time)  Medications Ordered in UC Medications  acetaminophen (TYLENOL) tablet 975 mg (975 mg Oral Given 10/17/20 1418)    Initial Impression / Assessment and Plan / UC Course  I have reviewed the triage vital signs and the nursing notes.  Pertinent labs & imaging results that were available during my care of the patient were reviewed by me and considered in my medical decision making (see chart for details).   RUQ abdominal pain, right flank pain, fever.  Temp 103.  Patient is ill-appearing and groaning in pain.  Sending her to the ED for evaluation.   Final Clinical Impressions(s) / UC Diagnoses   Final diagnoses:  Abdominal pain, right upper quadrant  Right flank pain  Fever, unspecified   Discharge Instructions   None    ED Prescriptions    None     PDMP not reviewed this encounter.   Sharion Balloon, NP 10/17/20 1422

## 2020-10-17 NOTE — ED Notes (Signed)
Pt reporting headache, nausea, feeling cold, back pain.

## 2020-10-17 NOTE — ED Notes (Signed)
Patient transported to CT 

## 2020-10-17 NOTE — ED Triage Notes (Addendum)
Pt presents with complaints of right flank pain and right lower abdominal pain. Concerned for a kidney infection. Pt states pain has been present x 2 days. Reports headache, nausea, shaking, and vaginal discharge that is clumpy.   Barkley Boards made aware of patient.

## 2020-10-17 NOTE — ED Notes (Signed)
Patient is being discharged from the Urgent Care and sent to the Emergency Department via POV. Per Barkley Boards, patient is in need of higher level of care due to acute abdomen and abnormal vital signs. Patient is aware and verbalizes understanding of plan of care.  Vitals:   10/17/20 1412  BP: 109/70  Pulse: (!) 135  Resp: (!) 22  Temp: (!) 103.2 F (39.6 C)  SpO2: 97%

## 2020-10-17 NOTE — ED Provider Notes (Signed)
[facility] EMERGENCY DEPARTMENT Provider Note   CSN: [id number] Arrival date & time: 10/17/20  1430     History Chief Complaint  Patient presents with  . Back Pain  . Chills  . Vaginal Discharge    Jennifer Randall is a 30 y.o. female.  HPI   Patient with no significant medical history presents to the emergency department with chief complaint of right lower back pain, vaginal discharge and fevers and chills.  Patient states she had right lower back pain for 2 days and the pain stayed consistent, describes it as a sharp sensation which she feels in her right lower back rating into her lower pelvic region.  She has associated vaginal discharge and pelvic cramping.  She endorses that she has noticed some dark urine but denies dysuria, urinary urgency or frequency or vaginal bleeding.  She states she has had UTIs in the past but this feels different.  She has no abdominal history, no history of kidney stones or pyelonephritis.  She endorses that she is sexually active, has no history of STDs, is not on birth control.  She does state that she recently had a miscarriage back in July and is concerned this may be related to it.  She denies headaches, fevers, chills, shortness of breath, chest pain, abdominal pain, nausea, vomiting, diarrhea, pedal edema.  Past Medical History:  Diagnosis Date  . Depression   . Heart murmur     There are no problems to display for this patient.   Past Surgical History:  Procedure Laterality Date  . TONSILLECTOMY    . TYMPANOSTOMY TUBE PLACEMENT       OB History    Gravida  3   Para  2   Term  2   Preterm  0   AB  0   Living  2     SAB  0   IAB  0   Ectopic  0   Multiple  0   Live Births  2           Family History  Problem Relation Age of Onset  . Cancer Father   . Hyperlipidemia Maternal Grandmother     Social History   Tobacco Use  . Smoking status: Never Smoker  . Smokeless tobacco: Never Used   Vaping Use  . Vaping Use: Never used  Substance Use Topics  . Alcohol use: Not Currently    Alcohol/week: 0.0 standard drinks  . Drug use: No    Home Medications Prior to Admission medications   Medication Sig Start Date End Date Taking? Authorizing Provider  ibuprofen (ADVIL) 200 MG tablet Take 600 mg by mouth every 6 (six) hours as needed (pain).   Yes [provider]    Allergies    Sulfa antibiotics  Review of Systems   Review of Systems  Constitutional: Positive for fever. Negative for chills.  HENT: Negative for congestion.   Respiratory: Negative for shortness of breath.   Cardiovascular: Negative for chest pain.  Gastrointestinal: Positive for nausea. Negative for abdominal pain and vomiting.  Genitourinary: Positive for pelvic pain and vaginal discharge. Negative for decreased urine volume, dysuria, enuresis and frequency.  Musculoskeletal: Positive for back pain.       Right lower back pain  Skin: Negative for rash.  Neurological: Negative for headaches.  Hematological: Does not bruise/bleed easily.    Physical Exam Updated Vital Signs BP (!) 100/58   Pulse (!) 124   Temp (!) 103  F (39.4 C) (Oral)   Resp (!) 31   LMP 09/28/2020   SpO2 97%   Physical Exam Vitals and nursing note reviewed. Exam conducted with a chaperone present.  Constitutional:      General: She is not in acute distress.    Appearance: Normal appearance. She is not ill-appearing.  HENT:     Head: Normocephalic and atraumatic.     Nose: No congestion.  Eyes:     Conjunctiva/sclera: Conjunctivae normal.  Cardiovascular:     Rate and Rhythm: Regular rhythm. Tachycardia present.     Pulses: Normal pulses.     Heart sounds: No murmur heard. No friction rub. No gallop.   Pulmonary:     Effort: No respiratory distress.     Breath sounds: No stridor. No wheezing, rhonchi or rales.  Abdominal:     General: There is no distension.     Palpations: Abdomen is soft.      Tenderness: There is abdominal tenderness. There is no right CVA tenderness, left CVA tenderness or guarding.     Comments: Patient's abdomen was nondistended, normoactive bowel sounds.  She had slight epigastric pain, slight tenderness to palpation in her pelvic region, no rebound tenderness, no peritoneal signs noted.  She had negative CVA tenderness.  Genitourinary:    Vagina: Vaginal discharge present.     Comments: Pelvic exam performed, exterior genitalia was examined there was no lesions, rashes or discharge noted.  Vaginal canal was patent, pink, white discharge present, no lesions or trauma noted.  Cervix was visualized there was no lesions, or other abnormalities noted.  No adnexal pain or chandelier sign noted Musculoskeletal:     Right lower leg: No edema.     Left lower leg: No edema.  Skin:    General: Skin is warm and dry.  Neurological:     Mental Status: She is alert.  Psychiatric:        Mood and Affect: Mood normal.     ED Results / Procedures / Treatments   Labs (all labs ordered are listed, but only abnormal results are displayed) Labs Reviewed  COMPREHENSIVE METABOLIC PANEL - Abnormal; Notable for the following components:      Result Value   Sodium 131 (*)    Potassium 3.2 (*)    Glucose, Bld 150 (*)    Calcium 8.7 (*)    All other components within normal limits  CBC - Abnormal; Notable for the following components:   WBC 21.7 (*)    All other components within normal limits  URINALYSIS, ROUTINE W REFLEX MICROSCOPIC - Abnormal; Notable for the following components:   APPearance HAZY (*)    Hgb urine dipstick LARGE (*)    Ketones, ur 20 (*)    Protein, ur 30 (*)    Leukocytes,Ua SMALL (*)    Bacteria, UA MANY (*)    All other components within normal limits  WET PREP, GENITAL  LIPASE, BLOOD  PREGNANCY, URINE  LACTIC ACID, PLASMA  LACTIC ACID, PLASMA  I-STAT BETA HCG BLOOD, ED ([facility], [facility], [facility] ONLY)  GC/CHLAMYDIA PROBE AMP ([facility]) NOT AT [facility]     EKG None  Radiology No results found.  Procedures Pelvic exam  Date/Time: 10/17/2020 10:20 PM Performed by: Marcello Fennel, PA-C Authorized by: Marcello Fennel, PA-C  Consent: Verbal consent obtained. Consent given by: patient Preparation: Patient was prepped and draped in the usual sterile fashion. Local anesthesia used: no  Anesthesia: Local anesthesia used: no  Sedation:  Patient sedated: no  Patient tolerance: patient tolerated the procedure well with no immediate complications    (including critical care time)  Medications Ordered in ED Medications  acetaminophen (TYLENOL) tablet 650 mg (650 mg Oral Given 10/17/20 2122)  sodium chloride 0.9 % bolus 1,000 mL (1,000 mLs Intravenous New Bag/Given 10/17/20 2151)  ondansetron (ZOFRAN) injection 4 mg (4 mg Intravenous Given 10/17/20 2149)  morphine 2 MG/ML injection 2 mg (2 mg Intravenous Given 10/17/20 2138)    ED Course  I have reviewed the triage vital signs and the nursing notes.  Pertinent labs & imaging results that were available during my care of the patient were reviewed by me and considered in my medical decision making (see chart for details).    MDM Rules/Calculators/A&P                          Patient presents with UTI-like symptoms.  She is alert, does not appear in acute stress, vital signs show fever and tachycardia.  Will obtain basic lab work-up, recommend pelvic exam, STD check, provide patient with fluids and Tylenol and reevaluate.  CBC shows leukocytosis of 21.7, no signs of anemia.  CMP shows hyponatremia of 131, hypokalemia 3.2, hyperglycemia 150, no AKI, no anion gap.  UA shows small leukocytes, 6-10 red blood cells, 11-20 white blood cells, many bacteria.  Urine pregnancy is negative  Low suspicion for ectopic pregnancy as urine pregnancy is negative.  I have low suspicion for ovarian torsion or PID as patient has no adnexal pain or chandelier sign noted.  Patient did have noted  white discharge during pelvis exam but this appears to be more physiological than infectious.  I have low suspicion for kidney stone as patient has no history of kidney stones, negative CVA tenderness, there is only a small amount of hematuria noted on UA.  I suspect patient suffering from acute cystitis vs possible pyelonephritis but I cannot exclude appendicitis, abdominal abscess, enteritis.  Will await CT imaging for further evaluation.  will start patient on IV antibiotics.  Due to shift change patient will be handed off to Dr. Alvino Chapel, he was provided HPI, current work-up, likely disposition.  I suspect patient suffering from acute cystitis versus pyelonephritis.  Pending imaging and vital signs patient could be discharged with outpatient antibiotics but if patient continues to be tachycardic I would recommend inpatient admission for IV antibiotics.  Final Clinical Impression(s) / ED Diagnoses Final diagnoses:  Fever, unspecified fever cause  Flank pain    Rx / DC Orders ED Discharge Orders    None       Aron Baba 10/17/20 2230    Davonna Belling, MD 10/17/20 2328

## 2020-10-18 ENCOUNTER — Emergency Department (HOSPITAL_COMMUNITY): Payer: BC Managed Care – PPO

## 2020-10-18 DIAGNOSIS — R509 Fever, unspecified: Secondary | ICD-10-CM | POA: Diagnosis not present

## 2020-10-18 DIAGNOSIS — Z809 Family history of malignant neoplasm, unspecified: Secondary | ICD-10-CM | POA: Diagnosis not present

## 2020-10-18 DIAGNOSIS — N898 Other specified noninflammatory disorders of vagina: Secondary | ICD-10-CM | POA: Diagnosis present

## 2020-10-18 DIAGNOSIS — R6521 Severe sepsis with septic shock: Secondary | ICD-10-CM

## 2020-10-18 DIAGNOSIS — E876 Hypokalemia: Secondary | ICD-10-CM | POA: Diagnosis present

## 2020-10-18 DIAGNOSIS — R109 Unspecified abdominal pain: Secondary | ICD-10-CM | POA: Diagnosis present

## 2020-10-18 DIAGNOSIS — N10 Acute tubulo-interstitial nephritis: Secondary | ICD-10-CM | POA: Diagnosis present

## 2020-10-18 DIAGNOSIS — A419 Sepsis, unspecified organism: Secondary | ICD-10-CM | POA: Diagnosis present

## 2020-10-18 DIAGNOSIS — F32A Depression, unspecified: Secondary | ICD-10-CM | POA: Diagnosis present

## 2020-10-18 DIAGNOSIS — E162 Hypoglycemia, unspecified: Secondary | ICD-10-CM | POA: Diagnosis present

## 2020-10-18 DIAGNOSIS — N2881 Hypertrophy of kidney: Secondary | ICD-10-CM | POA: Diagnosis present

## 2020-10-18 DIAGNOSIS — R102 Pelvic and perineal pain: Secondary | ICD-10-CM | POA: Insufficient documentation

## 2020-10-18 DIAGNOSIS — I959 Hypotension, unspecified: Secondary | ICD-10-CM

## 2020-10-18 DIAGNOSIS — Z87440 Personal history of urinary (tract) infections: Secondary | ICD-10-CM | POA: Diagnosis not present

## 2020-10-18 DIAGNOSIS — Z882 Allergy status to sulfonamides status: Secondary | ICD-10-CM | POA: Diagnosis not present

## 2020-10-18 DIAGNOSIS — R011 Cardiac murmur, unspecified: Secondary | ICD-10-CM | POA: Diagnosis present

## 2020-10-18 DIAGNOSIS — Z83438 Family history of other disorder of lipoprotein metabolism and other lipidemia: Secondary | ICD-10-CM | POA: Diagnosis not present

## 2020-10-18 DIAGNOSIS — N739 Female pelvic inflammatory disease, unspecified: Secondary | ICD-10-CM | POA: Diagnosis present

## 2020-10-18 DIAGNOSIS — B373 Candidiasis of vulva and vagina: Secondary | ICD-10-CM | POA: Diagnosis present

## 2020-10-18 DIAGNOSIS — Z20822 Contact with and (suspected) exposure to covid-19: Secondary | ICD-10-CM | POA: Diagnosis present

## 2020-10-18 LAB — BASIC METABOLIC PANEL
Anion gap: 9 (ref 5–15)
BUN: 6 mg/dL (ref 6–20)
CO2: 21 mmol/L — ABNORMAL LOW (ref 22–32)
Calcium: 7.2 mg/dL — ABNORMAL LOW (ref 8.9–10.3)
Chloride: 105 mmol/L (ref 98–111)
Creatinine, Ser: 0.72 mg/dL (ref 0.44–1.00)
GFR, Estimated: >60 mL/min (ref >60)
Glucose, Bld: 121 mg/dL — ABNORMAL HIGH (ref 70–99)
Potassium: 3.7 mmol/L (ref 3.5–5.1)
Sodium: 135 mmol/L (ref 135–145)

## 2020-10-18 LAB — CBG MONITORING, ED
Glucose-Capillary: 113 mg/dL — ABNORMAL HIGH (ref 70–99)
Glucose-Capillary: 113 mg/dL — ABNORMAL HIGH (ref 70–99)
Glucose-Capillary: 107 mg/dL — ABNORMAL HIGH (ref 70–99)

## 2020-10-18 LAB — GC/CHLAMYDIA PROBE AMP (~~LOC~~) NOT AT ARMC
Chlamydia: NEGATIVE
Comment: NEGATIVE
Comment: NORMAL
Neisseria Gonorrhea: NEGATIVE

## 2020-10-18 LAB — CBC
HCT: 36.1 % (ref 36.0–46.0)
Hemoglobin: 11.4 g/dL — ABNORMAL LOW (ref 12.0–15.0)
MCH: 29 pg (ref 26.0–34.0)
MCHC: 31.6 g/dL (ref 30.0–36.0)
MCV: 91.9 fL (ref 80.0–100.0)
Platelets: 197 10*3/uL (ref 150–400)
RBC: 3.93 MIL/uL (ref 3.87–5.11)
RDW: 12.1 % (ref 11.5–15.5)
WBC: 17.8 10*3/uL — ABNORMAL HIGH (ref 4.0–10.5)
nRBC: 0 % (ref 0.0–0.2)

## 2020-10-18 LAB — PROCALCITONIN: Procalcitonin: 1.04 ng/mL

## 2020-10-18 LAB — MOLECULAR ANCILLARY ONLY
Chlamydia: NEGATIVE
Comment: NEGATIVE
Comment: NORMAL
Neisseria Gonorrhea: NEGATIVE

## 2020-10-18 LAB — RESP PANEL BY RT-PCR (FLU A&B, COVID) ARPGX2
Influenza A by PCR: NEGATIVE
Influenza B by PCR: NEGATIVE
SARS Coronavirus 2 by RT PCR: NEGATIVE

## 2020-10-18 LAB — PROTIME-INR
INR: 1.4 — ABNORMAL HIGH (ref 0.8–1.2)
Prothrombin Time: 16.4 seconds — ABNORMAL HIGH (ref 11.4–15.2)

## 2020-10-18 LAB — CORTISOL-AM, BLOOD: Cortisol - AM: 22.7 ug/dL — ABNORMAL HIGH (ref 6.7–22.6)

## 2020-10-18 LAB — HIV ANTIBODY (ROUTINE TESTING W REFLEX): HIV Screen 4th Generation wRfx: NONREACTIVE

## 2020-10-18 LAB — LACTIC ACID, PLASMA: Lactic Acid, Venous: 1.5 mmol/L (ref 0.5–1.9)

## 2020-10-18 MED ORDER — SODIUM CHLORIDE 0.9 % IV BOLUS
1000.0000 mL | Freq: Once | INTRAVENOUS | Status: AC
  Administered 2020-10-18: 1000 mL via INTRAVENOUS

## 2020-10-18 MED ORDER — SODIUM CHLORIDE 0.9 % IV SOLN
250.0000 mL | INTRAVENOUS | Status: DC
  Administered 2020-10-18: 250 mL via INTRAVENOUS

## 2020-10-18 MED ORDER — LACTATED RINGERS IV BOLUS
1000.0000 mL | Freq: Once | INTRAVENOUS | Status: AC
  Administered 2020-10-18: 1000 mL via INTRAVENOUS

## 2020-10-18 MED ORDER — NOREPINEPHRINE 4 MG/250ML-% IV SOLN
2.0000 ug/min | INTRAVENOUS | Status: DC
  Administered 2020-10-18: 2 ug/min via INTRAVENOUS
  Filled 2020-10-18: qty 250

## 2020-10-18 MED ORDER — SODIUM CHLORIDE 0.9 % IV SOLN: 1.0000 g | INTRAVENOUS | Status: DC

## 2020-10-18 MED ORDER — ONDANSETRON HCL 4 MG/2ML IJ SOLN
4.0000 mg | Freq: Four times a day (QID) | INTRAMUSCULAR | Status: DC | PRN
  Administered 2020-10-19: 4 mg via INTRAVENOUS
  Filled 2020-10-18: qty 2

## 2020-10-18 MED ORDER — SODIUM CHLORIDE 0.9 % IV SOLN
1.0000 g | Freq: Three times a day (TID) | INTRAVENOUS | Status: DC
  Administered 2020-10-18 – 2020-10-19 (×4): 1 g via INTRAVENOUS
  Filled 2020-10-18 (×7): qty 1

## 2020-10-18 MED ORDER — LACTATED RINGERS IV SOLN: INTRAVENOUS | Status: DC

## 2020-10-18 MED ORDER — KETOROLAC TROMETHAMINE 15 MG/ML IJ SOLN: 15.0000 mg | Freq: Once | INTRAMUSCULAR | Status: DC | PRN

## 2020-10-18 MED ORDER — ACETAMINOPHEN 325 MG PO TABS
650.0000 mg | ORAL_TABLET | Freq: Four times a day (QID) | ORAL | Status: DC | PRN
  Administered 2020-10-18 – 2020-10-21 (×7): 650 mg via ORAL
  Filled 2020-10-18 (×7): qty 2

## 2020-10-18 MED ORDER — POTASSIUM CHLORIDE 10 MEQ/100ML IV SOLN
10.0000 meq | INTRAVENOUS | Status: AC
  Administered 2020-10-18 (×3): 10 meq via INTRAVENOUS
  Filled 2020-10-18 (×3): qty 100

## 2020-10-18 MED ORDER — IBUPROFEN 200 MG PO TABS
400.0000 mg | ORAL_TABLET | Freq: Four times a day (QID) | ORAL | Status: DC | PRN
  Administered 2020-10-18 – 2020-10-20 (×3): 400 mg via ORAL
  Filled 2020-10-18: qty 2
  Filled 2020-10-18: qty 1
  Filled 2020-10-18: qty 2

## 2020-10-18 MED ORDER — ENOXAPARIN SODIUM 40 MG/0.4ML ~~LOC~~ SOLN
40.0000 mg | SUBCUTANEOUS | Status: DC
  Administered 2020-10-18 – 2020-10-20 (×3): 40 mg via SUBCUTANEOUS
  Filled 2020-10-18 (×3): qty 0.4

## 2020-10-18 MED ORDER — DOCUSATE SODIUM 100 MG PO CAPS: 100.0000 mg | ORAL_CAPSULE | Freq: Two times a day (BID) | ORAL | Status: DC | PRN

## 2020-10-18 MED ORDER — ACETAMINOPHEN 650 MG RE SUPP: 650.0000 mg | Freq: Four times a day (QID) | RECTAL | Status: DC | PRN

## 2020-10-18 MED ORDER — IBUPROFEN 800 MG PO TABS
800.0000 mg | ORAL_TABLET | Freq: Once | ORAL | Status: AC
  Administered 2020-10-18: 800 mg via ORAL
  Filled 2020-10-18: qty 1

## 2020-10-18 MED ORDER — MORPHINE SULFATE (PF) 2 MG/ML IV SOLN
2.0000 mg | INTRAVENOUS | Status: DC | PRN
  Administered 2020-10-18 – 2020-10-19 (×3): 2 mg via INTRAVENOUS
  Filled 2020-10-18 (×4): qty 1

## 2020-10-18 MED ORDER — POLYETHYLENE GLYCOL 3350 17 G PO PACK: 17.0000 g | PACK | Freq: Every day | ORAL | Status: DC | PRN

## 2020-10-18 MED ORDER — ONDANSETRON HCL 4 MG PO TABS: 4.0000 mg | ORAL_TABLET | Freq: Four times a day (QID) | ORAL | Status: DC | PRN

## 2020-10-18 MED ORDER — FLUCONAZOLE 150 MG PO TABS
150.0000 mg | ORAL_TABLET | Freq: Once | ORAL | Status: AC
  Administered 2020-10-19: 150 mg via ORAL
  Filled 2020-10-18: qty 1

## 2020-10-18 NOTE — ED Notes (Signed)
Md aware pts low bp.

## 2020-10-18 NOTE — ED Notes (Signed)
Headache 7/10  Wants to wqait on pain meds

## 2020-10-18 NOTE — ED Notes (Addendum)
Slowed pts potassium down to 42ml/hr and LR continous to 150 ml/hr. Levo going at 65mcg.

## 2020-10-18 NOTE — ED Notes (Signed)
Attempted report x1. 

## 2020-10-18 NOTE — H&P (Signed)
History and Physical    Jennifer Randall DOB: 05-02-1991 DOA: 10/17/2020  PCP: Marlow Baars, MD  Patient coming from: Home  I have personally briefly reviewed patient's old medical records in [facility]  Chief Complaint: Fever, flank/back pain, dysuria  HPI: Jennifer Randall is a 30 y.o. female with medical history significant of miscarriage in July 2021.  Pt presents to the ED with c/o R lower back pain for 2 days, radiating around her flank.  Foul smelling urine.  Symptoms constant, worsening, nothing makes better or worse.  Does have fever, chills.  Denies SOB, CP, ABD pain, N/V/D.  Has had constant vaginal discharge since a miscarriage back in July.   ED Course:  Patient is septic with WBC 21k, fever 103.2, tachy to 130s.  Even hypotensive and dropping BP when we arnt bolusing fluids.  Got 2L IVF bolus thus far, L #3 is running now.  UA with 11-20 wbc, many bacteria, small LE, neg nitrites, large blood.  CTA abd/pelvis: 1) findings c/w R sided pyelonephritis 2) heterogenous appearance of uterus, unclear significance.  Pelvic exam: not very impressive per EDP.  Complete pelvic US: read pending.  Got 1g rocephin in ED.   Review of Systems: As per HPI, otherwise all review of systems negative.  Past Medical History:  Diagnosis Date  . Depression   . Heart murmur     Past Surgical History:  Procedure Laterality Date  . TONSILLECTOMY    . TYMPANOSTOMY TUBE PLACEMENT       reports that she has never smoked. She has never used smokeless tobacco. She reports previous alcohol use. She reports that she does not use drugs.  Allergies  Allergen Reactions  . Sulfa Antibiotics Hives    Family History  Problem Relation Age of Onset  . Cancer Father   . Hyperlipidemia Maternal Grandmother      Prior to Admission medications   Medication Sig Start Date End Date Taking? Authorizing Provider  ibuprofen (ADVIL) 200 MG tablet Take 600 mg  by mouth every 6 (six) hours as needed (pain).   Yes [provider]    Physical Exam: Vitals:   10/18/20 0015 10/18/20 0030 10/18/20 0100 10/18/20 0212  BP: 96/66 99/67 98/67    Pulse: (!) 105 (!) 102 (!) 109   Resp: (!) 25 20 (!) 23   Temp:    100.1 F (37.8 C)  TempSrc:    Oral  SpO2: 100% 100% 100%     Constitutional: Septic appearing, in rigors. Eyes: PERRL, lids and conjunctivae normal ENMT: Mucous membranes are moist. Posterior pharynx clear of any exudate or lesions.Normal dentition.  Neck: normal, supple, no masses, no thyromegaly Respiratory: clear to auscultation bilaterally, no wheezing, no crackles. Normal respiratory effort. No accessory muscle use.  Cardiovascular: Tachycardia Abdomen: no tenderness, no masses palpated. No hepatosplenomegaly. Bowel sounds positive.  Musculoskeletal: no clubbing / cyanosis. No joint deformity upper and lower extremities. Good ROM, no contractures. Normal muscle tone.  Skin: no rashes, lesions, ulcers. No induration Neurologic: CN 2-12 grossly intact. Sensation intact, DTR normal. Strength 5/5 in all 4.  Psychiatric: Normal judgment and insight. Alert and oriented x 3. Normal mood.    Labs on Admission: I have personally reviewed following labs and imaging studies  CBC: Recent Labs  Lab 10/17/20 1531  WBC 21.7*  HGB 13.1  HCT 40.5  MCV 90.0  PLT 279   Basic Metabolic Panel: Recent Labs  Lab 10/17/20 1531  NA 131*  K  3.2*  CL 98  CO2 22  GLUCOSE 150*  BUN 8  CREATININE 0.78  CALCIUM 8.7*   GFR: CrCl cannot be calculated (Unknown ideal weight.). Liver Function Tests: Recent Labs  Lab 10/17/20 1531  AST 16  ALT 14  ALKPHOS 55  BILITOT 0.9  PROT 6.8  ALBUMIN 3.7   Recent Labs  Lab 10/17/20 1531  LIPASE 21   No results for input(s): AMMONIA in the last 168 hours. Coagulation Profile: No results for input(s): INR, PROTIME in the last 168 hours. Cardiac Enzymes: No results for input(s): CKTOTAL,  CKMB, CKMBINDEX, TROPONINI in the last 168 hours. BNP (last 3 results) No results for input(s): PROBNP in the last 8760 hours. HbA1C: No results for input(s): HGBA1C in the last 72 hours. CBG: No results for input(s): GLUCAP in the last 168 hours. Lipid Profile: No results for input(s): CHOL, HDL, LDLCALC, TRIG, CHOLHDL, LDLDIRECT in the last 72 hours. Thyroid Function Tests: No results for input(s): TSH, T4TOTAL, FREET4, T3FREE, THYROIDAB in the last 72 hours. Anemia Panel: No results for input(s): VITAMINB12, FOLATE, FERRITIN, TIBC, IRON, RETICCTPCT in the last 72 hours. Urine analysis:    Component Value Date/Time   COLORURINE YELLOW 10/17/2020 1531   APPEARANCEUR HAZY (A) 10/17/2020 1531   LABSPEC 1.017 10/17/2020 1531   PHURINE 5.0 10/17/2020 1531   GLUCOSEU NEGATIVE 10/17/2020 1531   HGBUR LARGE (A) 10/17/2020 1531   BILIRUBINUR NEGATIVE 10/17/2020 1531   BILIRUBINUR negative 07/25/2015 1754   BILIRUBINUR neg 01/28/2015 1906   KETONESUR 20 (A) 10/17/2020 1531   PROTEINUR 30 (A) 10/17/2020 1531   UROBILINOGEN 0.2 01/26/2017 1351   NITRITE NEGATIVE 10/17/2020 1531   LEUKOCYTESUR SMALL (A) 10/17/2020 1531    Radiological Exams on Admission: CT ABDOMEN PELVIS W CONTRAST  Result Date: 10/18/2020 CLINICAL DATA:  Abdominal pain and fever EXAM: CT ABDOMEN AND PELVIS WITH CONTRAST TECHNIQUE: Multidetector CT imaging of the abdomen and pelvis was performed using the standard protocol following bolus administration of intravenous contrast. CONTRAST:  168mL OMNIPAQUE IOHEXOL 300 MG/ML  SOLN COMPARISON:  None. FINDINGS: LOWER CHEST: Normal. HEPATOBILIARY: Normal hepatic contours. No intra- or extrahepatic biliary dilatation. The gallbladder is normal. PANCREAS: Normal pancreas. No ductal dilatation or peripancreatic fluid collection. SPLEEN: Normal. ADRENALS/URINARY TRACT: The adrenal glands are normal. Enlarged right kidney with heterogeneous enhancement pattern. The urinary bladder is  normal for degree of distention STOMACH/BOWEL: There is no hiatal hernia. Normal duodenal course and caliber. No small bowel dilatation or inflammation. No focal colonic abnormality. Normal appendix. VASCULAR/LYMPHATIC: Normal course and caliber of the major abdominal vessels. No abdominal or pelvic lymphadenopathy. REPRODUCTIVE: Heterogeneous enhancement of the uterus. Left corpus luteum. Small amount of free fluid in the posterior cul-de-sac. MUSCULOSKELETAL. No bony spinal canal stenosis or focal osseous abnormality. OTHER: None. IMPRESSION: 1. Enlarged, heterogeneous right kidney with heterogeneous enhancement pattern. Correlate with urinalysis results as this pattern may be seen in pyelonephritis. 2. Heterogeneous enhancement of the uterus, nonspecific. Consider correlation with pelvic ultrasound. Electronically Signed   By: Ulyses Jarred M.D.   On: 10/18/2020 00:17    EKG: Independently reviewed.  Assessment/Plan Principal Problem:   Sepsis ([place]) Active Problems:   Acute pyelonephritis   Vaginal discharge    1. Sepsis - 1. Sepsis pathway 2. Most likely secondary to Pyelonephritis of R kidney as demonstrated on CT scan and C/W her reported symptoms of R back / flank pain, foul smelling urine, etc. 3. UCx pending 4. BCx ordered (but will be post ABx) 5. Cont  Rocephin 1g daily 6. IVF: 3rd L bolus going, then LR at 125 cc/hr 7. Tele monitor for tachycardia 2. Vaginal discharge - 1. Chronic and ongoing 2. EDP thought just physiologic 3. R/o RPOC with the pelvic US (if present, needs emergent GYN consult).  DVT prophylaxis: Lovenox Code Status: Full Family Communication: Husband at bedside Disposition Plan: Home after sepsis resolved Consults called: None Admission status: Admit to inpatient  Severity of Illness: The appropriate patient status for this patient is INPATIENT. Inpatient status is judged to be reasonable and necessary in order to provide the required intensity of  service to ensure the patient's safety. The patient's presenting symptoms, physical exam findings, and initial radiographic and laboratory data in the context of their chronic comorbidities is felt to place them at high risk for further clinical deterioration. Furthermore, it is not anticipated that the patient will be medically stable for discharge from the hospital within 2 midnights of admission. The following factors support the patient status of inpatient.   IP status due to severe sepsis with hypotension.  * I certify that at the point of admission it is my clinical judgment that the patient will require inpatient hospital care spanning beyond 2 midnights from the point of admission due to high intensity of service, high risk for further deterioration and high frequency of surveillance required.*    Kellis Topete M. DO [facility]  How to contact the [facility] Attending or Consulting provider [place] or covering provider during after hours [place], for this patient?  1. Check the care team in [facility] and look for a) attending/consulting [facility] provider listed and b) the [facility] team listed 2. Log into [email or web address]  Amion Physician Scheduling and messaging for groups and whole hospitals  On call and physician scheduling software for group practices, residents, hospitalists and other medical providers for call, clinic, rotation and shift schedules. On[employer] is a hospital-wide system for scheduling doctors and paging doctors on call. EasyPlot is for scientific plotting and data analysis.  [email or web address]  and use [place]'s universal password to access. If you do not have the password, please contact the hospital operator.  3. Locate the [facility] provider you are looking for under [facility] and page to a number that you can be directly reached. 4. If you still have difficulty reaching the provider, please page the [facility] (Director on Call) for the Hospitalists listed on amion for  assistance.  10/18/2020, 2:17 AM

## 2020-10-18 NOTE — Progress Notes (Signed)
Pharmacy Antibiotic Note  Jennifer Randall is a 30 y.o. female admitted on 10/17/2020 with sepsis.  Pharmacy has been consulted for meropenem dosing.  Plan: Meropenem 1gm IV q8 hours. F/u renal function, cultures and clinical course    Temp (24hrs), Avg:101 F (38.3 C), Min:98.5 F (36.9 C), Max:103.2 F (39.6 C)  Recent Labs  Lab 10/17/20 1531 10/17/20 2200 10/18/20 0242 10/18/20 0247  WBC 21.7*  --  17.8*  --   CREATININE 0.78  --  0.72  --   LATICACIDVEN  --  1.0  --  1.5    CrCl cannot be calculated (Unknown ideal weight.).    Allergies  Allergen Reactions  . Sulfa Antibiotics Hives    Thank you for allowing pharmacy to be a part of this patient's care.  Excell Seltzer Poteet 10/18/2020 5:16 AM

## 2020-10-18 NOTE — ED Notes (Signed)
Pt transported for Pelvic Exam.

## 2020-10-18 NOTE — Consult Note (Signed)
NAME:  Jennifer Randall, MRN:  [id number], DOB:  June 14, 1991, LOS: 0 ADMISSION DATE:  10/17/2020, CONSULTATION DATE:  10/18/20 REFERRING MD:  Alcario Drought, CHIEF COMPLAINT:  Flank pain   Brief History:  30 y.o. F with PMH of depression who presented with three days of R flank pain for 2-3 days associated with chills, nausea, headache and ongoing vaginal discharge since a miscarriage in July.  Workup consistent with pyelonephritis with persistent hypotension despite IVF.   History of Present Illness:  Jennifer Randall is a 30 y.o. F with PMH of depression and heart murmur who presented with 2-3 days of low back pain and chills associated with nausea and headache,  She initially presented to urgent care and was sent to the ED due to fever and tachycardia.  In the ED, labs showed WBC 17k, negative covid and flu test, UA with lg, Hgb, small leuks and many bacteria, no lactic acidosis and normal renal function.   CT abd/pelvis with enlarged R kidney.     Pt also reports persistent vaginal discharge since a miscarriage in July 2021, there was no reported cervical motion tenderness on pelvic exam done in the ED.  Pelvic US with heterogeneous uterus and prominent vascular flow, non-specific.   She has had a UTI in the past, but not recently and did not require admission.  She was given Rocephin and 4L IVF in the ED and PCCM was consulted for persistent hypotension.  Past Medical History:   has a past medical history of Depression and Heart murmur.   Significant Hospital Events:  1/11 Admit to PCCM  Consults:    Procedures:    Significant Diagnostic Tests:   1/11 CT abd/pelvis>>1. Enlarged, heterogeneous right kidney with heterogeneous enhancement pattern. Correlate with urinalysis results as this pattern may be seen in pyelonephritis. 2. Heterogeneous enhancement of the uterus, nonspecific. Consider correlation with pelvic ultrasound.  1/11 Pelvic US>>Heterogeneous appearance of the myometrium with  prominent vascular flow on color Doppler imaging. Findings are nonspecific and can be seen in the setting of pelvic congestion syndrome, particularly given the presence of prominent pelvic vasculature on both ultrasound and CT images. Alternative etiology could include adenomyosis though the imaging fair is somewhat atypical in the absence of shadowing.   Micro Data:  1/11 UC>> 1/11 BCx2>> 1/11 Covid-19 and Flu>>negative 1/11 Wet prep>>yeast and WBC's  Antimicrobials:   Ceftriaxone 1/10 Meropenem 1/11-  Interim History / Subjective:  Pt awake and alert, still feels feverish, but not dizzy  Objective   Blood pressure (!) 79/38, pulse (!) 112, temperature (!) 102.8 F (39.3 C), temperature source Oral, resp. rate (!) 28, last menstrual period 09/28/2020, SpO2 96 %, unknown if currently breastfeeding.       No intake or output data in the 24 hours ending 10/18/20 0456 There were no vitals filed for this visit.  General:  Well-nourished F, resting in bed in no acute distress HEENT: MM pink/moist Neuro: awake, alert and oriented CV: s1s2 tachycardic, no m/r/g PULM:  Clear bilaterally on room air without wheezing or rhonchi GI: soft, bsx4 active, mildly uncomfortable to palpation in the generalized lower abdomen Extremities: warm/dry, no edema  Msk: R CVA TTP Skin: no rashes or lesions   Resolved Hospital Problem list     Assessment & Plan:   Septic shock secondary to pyelonephritis, POA -Pt remains hypotensive after 4L IVF, initiate peripheral Levophed and continue LR @ 125hr -titrate Levophed to maintain MAP >65, if pressor needs are increasing may need CVC -  broaden antibiotic coverage to Meropenem while awaiting blood and urine cultures -monitor renal function and electrolytes -Follow I&O's  Hypokalemia  -repleted with 25meq's x3 in the ED   Vaginal discharge  -Yeast on wet prep, give Fluconazole 150mg x1, recommended outpatient OB/Gyn f/u for US findings of  possible vascular congestion     Best practice (evaluated daily)  Diet: regular Pain/Anxiety/Delirium protocol (if indicated): tylenol/NSAIDS VAP protocol (if indicated): n/a DVT prophylaxis: lovenox GI prophylaxis: n/a Glucose control: POC glucose checks Mobility: with assist Disposition:ICU  Goals of Care:  Last date of multidisciplinary goals of care discussion: Family and staff present:  Summary of discussion:  Follow up goals of care discussion due:  Code Status: full code  Labs   CBC: Recent Labs  Lab 10/17/20 1531 10/18/20 0242  WBC 21.7* 17.8*  HGB 13.1 11.4*  HCT 40.5 36.1  MCV 90.0 91.9  PLT 279 269    Basic Metabolic Panel: Recent Labs  Lab 10/17/20 1531 10/18/20 0242  NA 131* 135  K 3.2* 3.7  CL 98 105  CO2 22 21*  GLUCOSE 150* 121*  BUN 8 6  CREATININE 0.78 0.72  CALCIUM 8.7* 7.2*   GFR: CrCl cannot be calculated (Unknown ideal weight.). Recent Labs  Lab 10/17/20 1531 10/17/20 2200 10/18/20 0242 10/18/20 0247  WBC 21.7*  --  17.8*  --   LATICACIDVEN  --  1.0  --  1.5    Liver Function Tests: Recent Labs  Lab 10/17/20 1531  AST 16  ALT 14  ALKPHOS 55  BILITOT 0.9  PROT 6.8  ALBUMIN 3.7   Recent Labs  Lab 10/17/20 1531  LIPASE 21   No results for input(s): AMMONIA in the last 168 hours.  ABG No results found for: PHART, PCO2ART, PO2ART, HCO3, TCO2, ACIDBASEDEF, O2SAT   Coagulation Profile: Recent Labs  Lab 10/18/20 0242  INR 1.4*    Cardiac Enzymes: No results for input(s): CKTOTAL, CKMB, CKMBINDEX, TROPONINI in the last 168 hours.  HbA1C: No results found for: HGBA1C  CBG: No results for input(s): GLUCAP in the last 168 hours.  Review of Systems:   Negative except as noted in HPI  Past Medical History:  She,  has a past medical history of Depression and Heart murmur.   Surgical History:   Past Surgical History:  Procedure Laterality Date  . TONSILLECTOMY    . TYMPANOSTOMY TUBE PLACEMENT        Social History:   reports that she has never smoked. She has never used smokeless tobacco. She reports previous alcohol use. She reports that she does not use drugs.   Family History:  Her family history includes Cancer in her father; Hyperlipidemia in her maternal grandmother.   Allergies Allergies  Allergen Reactions  . Sulfa Antibiotics Hives     Home Medications  Prior to Admission medications   Medication Sig Start Date End Date Taking? Authorizing Provider  ibuprofen (ADVIL) 200 MG tablet Take 600 mg by mouth every 6 (six) hours as needed (pain).   Yes [provider]     Critical care time: 40 minutes     CRITICAL CARE Performed by: Otilio Carpen Bhavana Kady   Total critical care time: 40 minutes  Critical care time was exclusive of separately billable procedures and treating other patients.  Critical care was necessary to treat or prevent imminent or life-threatening deterioration.  Critical care was time spent personally by me on the following activities: development of treatment plan with patient and/or surrogate as  well as nursing, discussions with consultants, evaluation of patient's response to treatment, examination of patient, obtaining history from patient or surrogate, ordering and performing treatments and interventions, ordering and review of laboratory studies, ordering and review of radiographic studies, pulse oximetry and re-evaluation of patient's condition.   Otilio Carpen Jaclyne Haverstick, PA-C [facility]  See Amion for pager details

## 2020-10-18 NOTE — ED Notes (Signed)
Md notified of pts low bp 

## 2020-10-18 NOTE — ED Notes (Signed)
This RN took over for previous nurse Alana. Per Alana critical care came to see pt and wanted pt to have a 500 bolus of LR and then to stop Levophed to see how pt's BP does and if it will maintain without medication. 500 bolus of LR is complete. This RN stopped Levophed and will continue to monitor pt's BP closely.

## 2020-10-18 NOTE — ED Notes (Signed)
Lunch Tray Ordered @ 1715. 

## 2020-10-18 NOTE — ED Notes (Signed)
Lunch Tray Ordered @ [id number].

## 2020-10-18 NOTE — ED Notes (Signed)
SDU Breakfast Ordered 

## 2020-10-18 NOTE — Progress Notes (Signed)
Sepsis tracking by eLINK 

## 2020-10-18 NOTE — ED Notes (Signed)
Report given to floor Erline Levine, RN

## 2020-10-18 NOTE — Progress Notes (Signed)
NAME:  Jennifer Randall, MRN:  [id number], DOB:  1990-10-25, LOS: 0 ADMISSION DATE:  10/17/2020, CONSULTATION DATE:  10/18/20 REFERRING MD:  Jennifer Randall, CHIEF COMPLAINT:  Flank pain   Brief History:  30 y.o. F with PMH of depression who presented with three days of R flank pain for 2-3 days associated with chills, nausea, headache and ongoing vaginal discharge since a miscarriage in July.  Workup consistent with pyelonephritis with persistent hypotension despite IVF.   History of Present Illness:  Jennifer Randall is a 30 y.o. F with PMH of depression and heart murmur who presented with 2-3 days of low back pain and chills associated with nausea and headache,  She initially presented to urgent care and was sent to the ED due to fever and tachycardia.  In the ED, labs showed WBC 17k, negative covid and flu test, UA with lg, Hgb, small leuks and many bacteria, no lactic acidosis and normal renal function.   CT abd/pelvis with enlarged R kidney.     Pt also reports persistent vaginal discharge since a miscarriage in July 2021, there was no reported cervical motion tenderness on pelvic exam done in the ED.  Pelvic US with heterogeneous uterus and prominent vascular flow, non-specific.   She has had a UTI in the past, but not recently and did not require admission.  She was given Rocephin and 4L IVF in the ED and PCCM was consulted for persistent hypotension.  Past Medical History:   has a past medical history of Depression and Heart murmur.   Significant Hospital Events:  1/11 Admit to PCCM  Consults:    Procedures:    Significant Diagnostic Tests:   1/11 CT abd/pelvis>>1. Enlarged, heterogeneous right kidney with heterogeneous enhancement pattern. Correlate with urinalysis results as this pattern may be seen in pyelonephritis. 2. Heterogeneous enhancement of the uterus, nonspecific. Consider correlation with pelvic ultrasound.  1/11 Pelvic US>>Heterogeneous appearance of the myometrium with  prominent vascular flow on color Doppler imaging. Findings are nonspecific and can be seen in the setting of pelvic congestion syndrome, particularly given the presence of prominent pelvic vasculature on both ultrasound and CT images. Alternative etiology could include adenomyosis though the imaging fair is somewhat atypical in the absence of shadowing.   Micro Data:  1/11 UC>> 1/11 BCx2>> 1/11 Covid-19 and Flu>>negative 1/11 Wet prep>>yeast and WBC's  Antimicrobials:   Ceftriaxone 1/10 Meropenem 1/11-  Interim History / Subjective:  Pt awake and alert, still feels feverish, but not dizzy  Objective   Blood pressure 94/61, pulse (!) 104, temperature 98.4 F (36.9 C), temperature source Oral, resp. rate (!) 29, last menstrual period 09/28/2020, SpO2 99 %, unknown if currently breastfeeding.        Intake/Output Summary (Last 24 hours) at 10/18/2020 1046 Last data filed at 10/18/2020 0950 Gross per 24 hour  Intake 567.46 ml  Output --  Net 567.46 ml   There were no vitals filed for this visit.  General: Well-nourished well-developed female no acute distress HEENT: MM pink/moist no JVD or lymphadenopathy is appreciated Neuro: Grossly intact without CV: Defect heart sounds irregular regular rate rhythm rate ventricular rate 97 PULM: Clear to auscultation GI: soft, bsx4 active nontender GU: Needs to void Extremities: warm/dry, negative edema  Skin: no rashes or lesions    Resolved Hospital Problem list     Assessment & Plan:   Septic shock secondary to pyelonephritis, POA Lab Results  Component Value Date   CREATININE 0.72 10/18/2020   CREATININE 0.78 10/17/2020  CREATININE 0.60 05/05/2020   CREATININE 0.70 01/28/2015   CREATININE 0.74 06/21/2014     Blood pressure better with fluid resuscitation Replete fluid bolus Wean peripheral Levophed off She can be monitored in stepdown unit Pulmonary critical care will sign off at this time. Please call us if  needed. Note she is only +525 cc in 24 hours    Hypokalemia  Recent Labs  Lab 10/17/20 1531 10/18/20 0242  K 3.2* 3.7    Monitor and replete as needed   Vaginal discharge  Yeast on wet prep Treated with fluconazole Recommend outpatient GYN follow-up for possible vascular congestion     Best practice (evaluated daily)  Diet: regular Pain/Anxiety/Delirium protocol (if indicated): tylenol/NSAIDS VAP protocol (if indicated): n/a DVT prophylaxis: lovenox GI prophylaxis: n/a Glucose control: POC glucose checks Mobility: with assist Disposition:ICU  Goals of Care:  Last date of multidisciplinary goals of care discussion: Family and staff present:  Summary of discussion:  Follow up goals of care discussion due:  Code Status: full code  Labs   CBC: Recent Labs  Lab 10/17/20 1531 10/18/20 0242  WBC 21.7* 17.8*  HGB 13.1 11.4*  HCT 40.5 36.1  MCV 90.0 91.9  PLT 279 408    Basic Metabolic Panel: Recent Labs  Lab 10/17/20 1531 10/18/20 0242  NA 131* 135  K 3.2* 3.7  CL 98 105  CO2 22 21*  GLUCOSE 150* 121*  BUN 8 6  CREATININE 0.78 0.72  CALCIUM 8.7* 7.2*   GFR: CrCl cannot be calculated (Unknown ideal weight.). Recent Labs  Lab 10/17/20 1531 10/17/20 2200 10/18/20 0242 10/18/20 0247  PROCALCITON  --   --  1.04  --   WBC 21.7*  --  17.8*  --   LATICACIDVEN  --  1.0  --  1.5    Liver Function Tests: Recent Labs  Lab 10/17/20 1531  AST 16  ALT 14  ALKPHOS 55  BILITOT 0.9  PROT 6.8  ALBUMIN 3.7   Recent Labs  Lab 10/17/20 1531  LIPASE 21   No results for input(s): AMMONIA in the last 168 hours.  ABG No results found for: PHART, PCO2ART, PO2ART, HCO3, TCO2, ACIDBASEDEF, O2SAT   Coagulation Profile: Recent Labs  Lab 10/18/20 0242  INR 1.4*    Cardiac Enzymes: No results for input(s): CKTOTAL, CKMB, CKMBINDEX, TROPONINI in the last 168 hours.  HbA1C: No results found for: HGBA1C  CBG: Recent Labs  Lab 10/18/20 0816   GLUCAP 113*    Review of Systems:   Negative except as noted in HPI  Past Medical History:  She,  has a past medical history of Depression and Heart murmur.   Surgical History:   Past Surgical History:  Procedure Laterality Date  . TONSILLECTOMY    . TYMPANOSTOMY TUBE PLACEMENT       Social History:   reports that she has never smoked. She has never used smokeless tobacco. She reports previous alcohol use. She reports that she does not use drugs.   Family History:  Her family history includes Cancer in her father; Hyperlipidemia in her maternal grandmother.   Allergies Allergies  Allergen Reactions  . Sulfa Antibiotics Hives     Home Medications  Prior to Admission medications   Medication Sig Start Date End Date Taking? Authorizing Provider  ibuprofen (ADVIL) 200 MG tablet Take 600 mg by mouth every 6 (six) hours as needed (pain).   Yes [provider]          CRITICAL  CARE App cct 40 min   Richardson Landry Nasiir Monts ACNP Acute Care Nurse Practitioner [place] Please consult Amion 10/18/2020, 10:47 AM

## 2020-10-18 NOTE — ED Provider Notes (Signed)
Care assumed from Dr. Alvino Chapel at shift change.  Patient presenting with flank pain, dysuria, and fever for the past 2 days.  She also had a recent miscarriage that did not require D&C.  She denies discharge.  Care signed out to me awaiting results of a abdominal CT scan.  CT scan has resulted showing what appears to be acute pyelonephritis.  There are also is a heterogeneous appearance to the uterus, the significance of which I am uncertain.  An ultrasound has been recommended and ordered and is currently pending.  Patient has received IV Rocephin and will be admitted to the hospitalist service for further care.  I spoken with Dr. Alcario Drought who agrees to admit.  Patient did have a slight drop in her blood pressure while lying flat and resting.  Blood pressure dropped to approximately 89 systolic.  An additional liter of fluid will be given.   Veryl Speak, MD 10/18/20 [phone]

## 2020-10-19 ENCOUNTER — Encounter (HOSPITAL_COMMUNITY): Payer: Self-pay | Admitting: Pulmonary Disease

## 2020-10-19 DIAGNOSIS — N739 Female pelvic inflammatory disease, unspecified: Secondary | ICD-10-CM

## 2020-10-19 LAB — GLUCOSE, CAPILLARY
Glucose-Capillary: 98 mg/dL (ref 70–99)
Glucose-Capillary: 89 mg/dL (ref 70–99)
Glucose-Capillary: 124 mg/dL — ABNORMAL HIGH (ref 70–99)

## 2020-10-19 LAB — CBC WITH DIFFERENTIAL/PLATELET
Abs Immature Granulocytes: 0.08 10*3/uL — ABNORMAL HIGH (ref 0.00–0.07)
Basophils Absolute: 0 10*3/uL (ref 0.0–0.1)
Basophils Relative: 0 %
Eosinophils Absolute: 0 10*3/uL (ref 0.0–0.5)
Eosinophils Relative: 0 %
HCT: 31 % — ABNORMAL LOW (ref 36.0–46.0)
Hemoglobin: 10.8 g/dL — ABNORMAL LOW (ref 12.0–15.0)
Immature Granulocytes: 1 %
Lymphocytes Relative: 10 %
Lymphs Abs: 1.5 10*3/uL (ref 0.7–4.0)
MCH: 30.5 pg (ref 26.0–34.0)
MCHC: 34.8 g/dL (ref 30.0–36.0)
MCV: 87.6 fL (ref 80.0–100.0)
Monocytes Absolute: 1.3 10*3/uL — ABNORMAL HIGH (ref 0.1–1.0)
Monocytes Relative: 9 %
Neutro Abs: 11.7 10*3/uL — ABNORMAL HIGH (ref 1.7–7.7)
Neutrophils Relative %: 80 %
Platelets: 199 10*3/uL (ref 150–400)
RBC: 3.54 MIL/uL — ABNORMAL LOW (ref 3.87–5.11)
RDW: 12.3 % (ref 11.5–15.5)
WBC: 14.7 10*3/uL — ABNORMAL HIGH (ref 4.0–10.5)
nRBC: 0 % (ref 0.0–0.2)

## 2020-10-19 LAB — URINE CULTURE

## 2020-10-19 LAB — MAGNESIUM: Magnesium: 1.5 mg/dL — ABNORMAL LOW (ref 1.7–2.4)

## 2020-10-19 MED ORDER — SIMETHICONE 80 MG PO CHEW
80.0000 mg | CHEWABLE_TABLET | Freq: Once | ORAL | Status: AC | PRN
  Administered 2020-10-19: 80 mg via ORAL
  Filled 2020-10-19: qty 1

## 2020-10-19 MED ORDER — METRONIDAZOLE IN NACL 5-0.79 MG/ML-% IV SOLN
500.0000 mg | Freq: Two times a day (BID) | INTRAVENOUS | Status: DC
  Administered 2020-10-19 – 2020-10-21 (×4): 500 mg via INTRAVENOUS
  Filled 2020-10-19 (×4): qty 100

## 2020-10-19 MED ORDER — DOXYCYCLINE HYCLATE 100 MG PO TABS
100.0000 mg | ORAL_TABLET | Freq: Two times a day (BID) | ORAL | Status: DC
Start: 2020-10-19 — End: 2020-10-21
  Administered 2020-10-19 – 2020-10-21 (×5): 100 mg via ORAL
  Filled 2020-10-19 (×5): qty 1

## 2020-10-19 MED ORDER — MAGNESIUM OXIDE 400 (241.3 MG) MG PO TABS
400.0000 mg | ORAL_TABLET | Freq: Two times a day (BID) | ORAL | Status: DC
  Administered 2020-10-19 – 2020-10-21 (×4): 400 mg via ORAL
  Filled 2020-10-19 (×4): qty 1

## 2020-10-19 MED ORDER — MAGNESIUM SULFATE 2 GM/50ML IV SOLN
2.0000 g | Freq: Once | INTRAVENOUS | Status: DC
  Administered 2020-10-19: 2 g via INTRAVENOUS
  Filled 2020-10-19: qty 50

## 2020-10-19 MED ORDER — SODIUM CHLORIDE 0.9 % IV SOLN
2.0000 g | INTRAVENOUS | Status: DC
  Administered 2020-10-19 – 2020-10-21 (×3): 2 g via INTRAVENOUS
  Filled 2020-10-19 (×3): qty 0.08

## 2020-10-19 MED ORDER — PANTOPRAZOLE SODIUM 40 MG PO TBEC
40.0000 mg | DELAYED_RELEASE_TABLET | Freq: Every day | ORAL | Status: DC
  Administered 2020-10-19 – 2020-10-21 (×3): 40 mg via ORAL
  Filled 2020-10-19 (×3): qty 1

## 2020-10-19 NOTE — Progress Notes (Signed)
PROGRESS NOTE    Jennifer Randall  FXT:[id number] DOB: 05/04/1991 DOA: 10/17/2020 PCP: Jerelyn Charles, MD    Brief Narrative:  Jennifer Randall was admitted to the hospital working diagnosis of sepsis due to pelvic inflammatory disease.  30 year old female with no significant past medical history who presents with fever, lower abdominal pain. Symptoms ongoing for 2 days.  Positive vaginal discharge.  She came to the hospital due to persistent and worsening symptoms. On her initial physical examination patient was febrile 103.2 F, tachycardic 130s beats per minute, blood pressure 96/66, her lungs are clear to auscultation bilaterally, heart S1-S2, pleasant, tachycardic, soft abdomen, no lower extremity edema.  Sodium 131, potassium 3.2, chloride 98, bicarb 22, glucose 150, BUN 8, creatinine 0.78, white count 21.7, hemoglobin 13.1, hematocrit 40.5, platelets 279. SARS COVID-19 negative. Urinalysis specific rate of 1.017, 30 protein, 6-10 red cells, many bacteria, 11-20 white cells.  CT of the abdomen with enlarged heterogeneous right kidney with heterogeneous enhancement pattern. Heterogeneous enhancement of the uterus.  EKG 135 bpm, rightward axis, normal intervals, sinus rhythm, no ST segment or T wave changes.  Assessment & Plan:   Principal Problem:   Sepsis ([place]) Active Problems:   Acute pyelonephritis   Vaginal discharge   Pelvic inflammatory disease   1. Sepsis due to possible inflammatory pelvic disease/ rule out pyelonephritis. At home patient with no dysuria or increase urinary frequency. Positive vaginal discharge.  Wbc wet prep 0-3/ chlamydia negative/ clue cells 3-6/ GC negative/   [place] with heterogeneous appearance of the myometrium with prominent vascular flow on color doppler, nonspecific.  Today wbc is down to 17,8 from 21,7.   Continue to be tachycardic with Bp systolic down to 88 mmhg.   Patient with abdominal pain right upper quadrant this am. No frank urinary  symptoms, I suspect possible pelvic inflammatory syndrome. Will add metronidazole, doxycycline and change meropenem to ceftriaxone.   Continue close follow up on cell count, temperature curve and cultures. Continue pain control with morphine.   2. Hyponatremia-hypokalemia-hypomagnesemia/ hypovolemic. Na up to 135 and K at 3,7, continue close monitoring of electrolytes. Hold on IV fluids for now, patient is tolerating po well. Follow a restrictive IV fluids strategy post acute sepsis.   3. Vaginal yeast. Sp one dose of fluconazole.   4. Mild hypoglycemia. Improved appetite today, continue close monitoring of capillary glucose.  Fasting glucose this am 121 mg/dl   Patient continue to be at high risk for worsening sepsis   Status is: Inpatient  Remains inpatient appropriate because:IV treatments appropriate due to intensity of illness or inability to take PO   Dispo: The patient is from: Home              Anticipated d/c is to: Home              Anticipated d/c date is: 2 days              Patient currently is not medically stable to d/c.   DVT prophylaxis: Enoxaparin   Code Status:   full  Family Communication:  I spoke with patient's husband at the bedside, we talked in detail about patient's condition, plan of care and prognosis and all questions were addressed.      Consultants:   CC   Antimicrobials:   Ceftriaxone, doxycycline, metronidazole,     Subjective: Patient with abdominal pin, mid abdomen and right upper quadrant, no nausea or vomiting, denies dysuria.   Objective: Vitals:   10/19/20 0420 10/19/20  0520 10/19/20 0525 10/19/20 0746  BP: (!) 101/55 (!) 88/61 (!) 94/57 96/60  Pulse: (!) 109 98 (!) 102 94  Resp:   16 16  Temp:   98.9 F (37.2 C) 98 F (36.7 C)  TempSrc:   Oral Oral  SpO2: 96% 96% 97% 99%  Weight:   55.2 kg   Height:        Intake/Output Summary (Last 24 hours) at 10/19/2020 1145 Last data filed at 10/19/2020 1012 Gross per 24 hour   Intake 3238.78 ml  Output 600 ml  Net 2638.78 ml   Filed Weights   10/18/20 2226 10/19/20 0416 10/19/20 0525  Weight: 55.3 kg 55.3 kg 55.2 kg    Examination:   General: deconditioned  Neurology: Awake and alert, non focal  E ENT: no pallor, no icterus, oral mucosa moist Cardiovascular: No JVD. S1-S2 present, rhythmic, tachycardic with no gallops, rubs, or murmurs. + non pitting upper and lower extremity edema. Pulmonary: vesicular breath sounds bilaterally, Gastrointestinal. Abdomen mild distended, non tender to superficial palpatin after IV morphine Skin. No rashes Musculoskeletal: no joint deformities     Data Reviewed: I have personally reviewed following labs and imaging studies  CBC: Recent Labs  Lab 10/17/20 1531 10/18/20 0242  WBC 21.7* 17.8*  HGB 13.1 11.4*  HCT 40.5 36.1  MCV 90.0 91.9  PLT 279 500   Basic Metabolic Panel: Recent Labs  Lab 10/17/20 1531 10/18/20 0242  NA 131* 135  K 3.2* 3.7  CL 98 105  CO2 22 21*  GLUCOSE 150* 121*  BUN 8 6  CREATININE 0.78 0.72  CALCIUM 8.7* 7.2*  MG  --  1.5*   GFR: Estimated Creatinine Clearance: 85.8 mL/min (by C-G formula based on SCr of 0.72 mg/dL). Liver Function Tests: Recent Labs  Lab 10/17/20 1531  AST 16  ALT 14  ALKPHOS 55  BILITOT 0.9  PROT 6.8  ALBUMIN 3.7   Recent Labs  Lab 10/17/20 1531  LIPASE 21   No results for input(s): AMMONIA in the last 168 hours. Coagulation Profile: Recent Labs  Lab 10/18/20 0242  INR 1.4*   Cardiac Enzymes: No results for input(s): CKTOTAL, CKMB, CKMBINDEX, TROPONINI in the last 168 hours. BNP (last 3 results) No results for input(s): PROBNP in the last 8760 hours. HbA1C: No results for input(s): HGBA1C in the last 72 hours. CBG: Recent Labs  Lab 10/18/20 0816 10/18/20 1152 10/18/20 1659 10/19/20 0749  GLUCAP 113* 113* 107* 98   Lipid Profile: No results for input(s): CHOL, HDL, LDLCALC, TRIG, CHOLHDL, LDLDIRECT in the last 72  hours. Thyroid Function Tests: No results for input(s): TSH, T4TOTAL, FREET4, T3FREE, THYROIDAB in the last 72 hours. Anemia Panel: No results for input(s): VITAMINB12, FOLATE, FERRITIN, TIBC, IRON, RETICCTPCT in the last 72 hours.    Radiology Studies: I have reviewed all of the imaging during this hospital visit personally     Scheduled Meds: . enoxaparin (LOVENOX) injection  40 mg Subcutaneous Q24H   Continuous Infusions: . sodium chloride Stopped (10/18/20 1808)  . lactated ringers 125 mL/hr at 10/19/20 0126  . meropenem (MERREM) IV 1 g (10/19/20 0527)  . norepinephrine (LEVOPHED) Adult infusion Stopped (10/18/20 0950)     LOS: 1 day        Clorinda Wyble Gerome Apley, MD

## 2020-10-19 NOTE — Progress Notes (Signed)
   10/19/20 0325  Assess: MEWS Score  Temp (!) 103 F (39.4 C)  BP 102/60  Pulse Rate (!) 126  ECG Heart Rate (!) 129  Resp 18  Level of Consciousness Alert  SpO2 100 %  O2 Device Room Air  Assess: MEWS Score  MEWS Temp 2  MEWS Systolic 0  MEWS Pulse 2  MEWS RR 0  MEWS LOC 0  MEWS Score 4  MEWS Score Color Red  Assess: if the MEWS score is Yellow or Red  Were vital signs taken at a resting state? Yes  Focused Assessment No change from prior assessment  Early Detection of Sepsis Score *See Row Information* Low  MEWS guidelines implemented *See Row Information* Yes  Treat  MEWS Interventions Escalated (See documentation below)  Pain Scale 0-10  Pain Score 0  Take Vital Signs  Increase Vital Sign Frequency  Red: Q 1hr X 4 then Q 4hr X 4, if remains red, continue Q 4hrs  Escalate  MEWS: Escalate Red: discuss with charge nurse/RN and provider, consider discussing with RRT  Notify: Charge Nurse/RN  Name of Charge Nurse/RN Notified Cordella Register Rn  Date Charge Nurse/RN Notified 10/19/20  Time Charge Nurse/RN Notified 0434  Notify: Provider  Provider Name/Title DR Marlowe Sax  Date Provider Notified 10/19/20  Time Provider Notified [phone]  Notification Type Page  Notification Reason Change in status (RED MEWS)  Response No new orders  Document  Patient Outcome Other (Comment) (STABLE)

## 2020-10-19 NOTE — Plan of Care (Signed)
  Problem: Clinical Measurements: Goal: Diagnostic test results will improve Outcome: Progressing Goal: Signs and symptoms of infection will decrease Outcome: Progressing   Problem: Education: Goal: Knowledge of General Education information will improve Description: Including pain rating scale, medication(s)/side effects and non-pharmacologic comfort measures Outcome: Progressing   Problem: Health Behavior/Discharge Planning: Goal: Ability to manage health-related needs will improve Outcome: Progressing   Problem: Clinical Measurements: Goal: Ability to maintain clinical measurements within normal limits will improve Outcome: Progressing   Problem: Clinical Measurements: Goal: Diagnostic test results will improve Outcome: Progressing   Problem: Nutrition: Goal: Adequate nutrition will be maintained Outcome: Progressing   Problem: Activity: Goal: Risk for activity intolerance will decrease Outcome: Progressing   Problem: Safety: Goal: Ability to remain free from injury will improve Outcome: Progressing   Problem: Clinical Measurements: Goal: Diagnostic test results will improve Outcome: Progressing   Problem: Clinical Measurements: Goal: Signs and symptoms of infection will decrease Outcome: Progressing

## 2020-10-20 LAB — CBC WITH DIFFERENTIAL/PLATELET
Abs Immature Granulocytes: 0.02 10*3/uL (ref 0.00–0.07)
Basophils Absolute: 0 10*3/uL (ref 0.0–0.1)
Basophils Relative: 0 %
Eosinophils Absolute: 0 10*3/uL (ref 0.0–0.5)
Eosinophils Relative: 0 %
HCT: 32.9 % — ABNORMAL LOW (ref 36.0–46.0)
Hemoglobin: 10.6 g/dL — ABNORMAL LOW (ref 12.0–15.0)
Immature Granulocytes: 0 %
Lymphocytes Relative: 12 %
Lymphs Abs: 1 10*3/uL (ref 0.7–4.0)
MCH: 28.9 pg (ref 26.0–34.0)
MCHC: 32.2 g/dL (ref 30.0–36.0)
MCV: 89.6 fL (ref 80.0–100.0)
Monocytes Absolute: 0.5 10*3/uL (ref 0.1–1.0)
Monocytes Relative: 6 %
Neutro Abs: 6.9 10*3/uL (ref 1.7–7.7)
Neutrophils Relative %: 82 %
Platelets: 238 10*3/uL (ref 150–400)
RBC: 3.67 MIL/uL — ABNORMAL LOW (ref 3.87–5.11)
RDW: 12.4 % (ref 11.5–15.5)
WBC: 8.5 10*3/uL (ref 4.0–10.5)
nRBC: 0 % (ref 0.0–0.2)

## 2020-10-20 LAB — BASIC METABOLIC PANEL
Anion gap: 8 (ref 5–15)
BUN: 5 mg/dL — ABNORMAL LOW (ref 6–20)
CO2: 26 mmol/L (ref 22–32)
Calcium: 7.9 mg/dL — ABNORMAL LOW (ref 8.9–10.3)
Chloride: 105 mmol/L (ref 98–111)
Creatinine, Ser: 0.61 mg/dL (ref 0.44–1.00)
GFR, Estimated: >60 mL/min (ref >60)
Glucose, Bld: 119 mg/dL — ABNORMAL HIGH (ref 70–99)
Potassium: 4 mmol/L (ref 3.5–5.1)
Sodium: 139 mmol/L (ref 135–145)

## 2020-10-20 MED ORDER — SIMETHICONE 80 MG PO CHEW
80.0000 mg | CHEWABLE_TABLET | Freq: Four times a day (QID) | ORAL | Status: DC | PRN
  Administered 2020-10-20: 80 mg via ORAL
  Filled 2020-10-20: qty 1

## 2020-10-20 NOTE — Progress Notes (Addendum)
PROGRESS NOTE    Jennifer Randall  POE:[id number] DOB: Jun 15, 1991 DOA: 10/17/2020 PCP: Jennifer Charles, MD    Brief Narrative:  Jennifer Randall was admitted to the hospital working diagnosis of sepsis due to pelvic inflammatory disease.  30 year old female with no significant past medical history who presents with fever, lower abdominal pain. Symptoms ongoing for 2 days.  Positive vaginal discharge.  She came to the hospital due to persistent and worsening symptoms. On her initial physical examination patient was febrile 103.2 F, tachycardic 130s beats per minute, blood pressure 96/66, her lungs are clear to auscultation bilaterally, heart S1-S2, pleasant, tachycardic, soft abdomen, no lower extremity edema.  Sodium 131, potassium 3.2, chloride 98, bicarb 22, glucose 150, BUN 8, creatinine 0.78, white count 21.7, hemoglobin 13.1, hematocrit 40.5, platelets 279. SARS COVID-19 negative. Urinalysis specific rate of 1.017, 30 protein, 6-10 red cells, many bacteria, 11-20 white cells.  CT of the abdomen with enlarged heterogeneous right kidney with heterogeneous enhancement pattern. Heterogeneous enhancement of the uterus.  EKG 135 bpm, rightward axis, normal intervals, sinus rhythm, no ST segment or T wave changes.   Assessment & Plan:   Principal Problem:   Sepsis ([place]) Active Problems:   Acute pyelonephritis   Vaginal discharge   Pelvic inflammatory disease  1. Sepsis due to possible inflammatory pelvic disease/ rule out pyelonephritis. Wbc wet prep many/ chlamydia negative/ clue cells 3-6/ GC negative/  [place] with heterogeneous appearance of the myometrium with prominent vascular flow on color doppler, nonspecific.   Vaginal discharge has resolved and persistent mild right flank costovertebral angle tenderness.  Last fever spike yesterday pm and her abdominal pain has improved.  Sepsis can be from pyelonephritis, but can't rule out pelvic inflammatory disease. Will continue  current antibiotic therapy with ceftriaxone, doxycycline and metronidazole.  Follow up on temperature curve and cell count, possible dc home in am with oral antibiotic therapy.  Discontinue telemetry monitoring.   2. Hyponatremia-hypokalemia-hypomagnesemia/ hypovolemic. Electrolytes have corrected with Na at 139, K is 4,0 and bicarbonate at 26. Hold  On further IV fluids.   3. Vaginal yeast. Sp one dose of fluconazole.   4. Mild hypoglycemia. Stable with fasting glucose this am 119.      Status is: Inpatient  Remains inpatient appropriate because:IV treatments appropriate due to intensity of illness or inability to take PO   Dispo: The patient is from: Home              Anticipated d/c is to: Home              Anticipated d/c date is: 1 day              Patient currently is not medically stable to d/c.   DVT prophylaxis: Enoxaparin   Code Status:   full  Family Communication:  I spoke with patient's husband at the bedside, we talked in detail about patient's condition, plan of care and prognosis and all questions were addressed.     Antimicrobials:   Ceftriaxone, doxycycline and metronidazole     Subjective: Patient with improvement in abdominal pain, no nausea or vomiting, positive gas and bowel movements.   Objective: Vitals:   10/20/20 0600 10/20/20 0610 10/20/20 0809 10/20/20 1248  BP: 104/68  103/79 114/75  Pulse: 80  93 100  Resp:   16   Temp:   97.9 F (36.6 C) 97.8 F (36.6 C)  TempSrc:   Oral Axillary  SpO2: 97%  100% 100%  Weight:  54.7 kg  Height:        Intake/Output Summary (Last 24 hours) at 10/20/2020 1327 Last data filed at 10/20/2020 0900 Gross per 24 hour  Intake 760 ml  Output 925 ml  Net -165 ml   Filed Weights   10/19/20 0416 10/19/20 0525 10/20/20 0610  Weight: 55.3 kg 55.2 kg 54.7 kg    Examination:   General: not in pain or dyspnea.  Neurology: Awake and alert, non focal  E ENT: no pallor, no icterus, oral mucosa  moist Cardiovascular: No JVD. S1-S2 present, rhythmic, no gallops, rubs, or murmurs. No lower extremity edema. Pulmonary: vesicular breath sounds bilaterally, adequate air movement, no wheezing, rhonchi or rales. Gastrointestinal. Abdomen soft and non tender. No rebound or giarding Skin. No rashes Musculoskeletal: no joint deformities     Data Reviewed: I have personally reviewed following labs and imaging studies  CBC: Recent Labs  Lab 10/17/20 1531 10/18/20 0242 10/19/20 1451  WBC 21.7* 17.8* 14.7*  NEUTROABS  --   --  11.7*  HGB 13.1 11.4* 10.8*  HCT 40.5 36.1 31.0*  MCV 90.0 91.9 87.6  PLT 279 197 263   Basic Metabolic Panel: Recent Labs  Lab 10/17/20 1531 10/18/20 0242 10/20/20 0230  NA 131* 135 139  K 3.2* 3.7 4.0  CL 98 105 105  CO2 22 21* 26  GLUCOSE 150* 121* 119*  BUN 8 6 5*  CREATININE 0.78 0.72 0.61  CALCIUM 8.7* 7.2* 7.9*  MG  --  1.5*  --    GFR: Estimated Creatinine Clearance: 85.8 mL/min (by C-G formula based on SCr of 0.61 mg/dL). Liver Function Tests: Recent Labs  Lab 10/17/20 1531  AST 16  ALT 14  ALKPHOS 55  BILITOT 0.9  PROT 6.8  ALBUMIN 3.7   Recent Labs  Lab 10/17/20 1531  LIPASE 21   No results for input(s): AMMONIA in the last 168 hours. Coagulation Profile: Recent Labs  Lab 10/18/20 0242  INR 1.4*   Cardiac Enzymes: No results for input(s): CKTOTAL, CKMB, CKMBINDEX, TROPONINI in the last 168 hours. BNP (last 3 results) No results for input(s): PROBNP in the last 8760 hours. HbA1C: No results for input(s): HGBA1C in the last 72 hours. CBG: Recent Labs  Lab 10/18/20 1152 10/18/20 1659 10/19/20 0749 10/19/20 1211 10/19/20 1543  GLUCAP 113* 107* 98 89 124*   Lipid Profile: No results for input(s): CHOL, HDL, LDLCALC, TRIG, CHOLHDL, LDLDIRECT in the last 72 hours. Thyroid Function Tests: No results for input(s): TSH, T4TOTAL, FREET4, T3FREE, THYROIDAB in the last 72 hours. Anemia Panel: No results for  input(s): VITAMINB12, FOLATE, FERRITIN, TIBC, IRON, RETICCTPCT in the last 72 hours.    Radiology Studies: I have reviewed all of the imaging during this hospital visit personally     Scheduled Meds: . doxycycline  100 mg Oral Q12H  . enoxaparin (LOVENOX) injection  40 mg Subcutaneous Q24H  . magnesium oxide  400 mg Oral BID  . pantoprazole  40 mg Oral Daily   Continuous Infusions: . sodium chloride Stopped (10/18/20 1808)  . cefTRIAXone (ROCEPHIN)  IV 2 g (10/20/20 1250)  . metronidazole 500 mg (10/20/20 0547)     LOS: 2 days        Redmond Whittley Gerome Apley, MD

## 2020-10-21 LAB — URINE CULTURE: Culture: NO GROWTH

## 2020-10-21 MED ORDER — DOXYCYCLINE HYCLATE 100 MG PO TABS: 100.0000 mg | ORAL_TABLET | Freq: Two times a day (BID) | ORAL | 0 refills | Status: AC

## 2020-10-21 MED ORDER — ACETAMINOPHEN 325 MG PO TABS: 650.0000 mg | ORAL_TABLET | Freq: Four times a day (QID) | ORAL | Status: DC | PRN

## 2020-10-21 MED ORDER — METRONIDAZOLE 500 MG PO TABS: 500.0000 mg | ORAL_TABLET | Freq: Three times a day (TID) | ORAL | 0 refills | Status: AC

## 2020-10-21 MED ORDER — PANTOPRAZOLE SODIUM 40 MG PO TBEC: 40.0000 mg | DELAYED_RELEASE_TABLET | Freq: Every day | ORAL | 0 refills | Status: DC

## 2020-10-21 MED ORDER — METRONIDAZOLE 500 MG PO TABS
500.0000 mg | ORAL_TABLET | Freq: Three times a day (TID) | ORAL | Status: DC
  Administered 2020-10-21: 500 mg via ORAL
  Filled 2020-10-21: qty 1

## 2020-10-21 NOTE — Discharge Summary (Signed)
Physician Discharge Summary  Jennifer Randall [id number] DOB: 1991/06/08 DOA: 10/17/2020  PCP: Jennifer Charles, MD  Admit date: 10/17/2020 Discharge date: 10/21/2020  Admitted From: Home  Disposition:  Home   Recommendations for Outpatient Follow-up and new medication changes:  1. Follow up with Jennifer Randall in 7 days.  2. Patient received 3 doses of IV ceftriaxone in the hospital and will continue with oral doxycycline and metronidazole to complete a full course of 14 days.   Home Health: no   Equipment/Devices: no    Discharge Condition: stable  CODE STATUS: full  Diet recommendation: regular.   Brief/Interim Summary: Jennifer Randall admitted to the hospital working diagnosis of sepsis due topelvic inflammatory disease possible urine infection.  30 year old female with no significant past medical history who presents with fever,lower abdominal pain.Symptoms ongoing for 2 days. Positive vaginal discharge.  She came to the hospital due to persistent and worsening symptoms. On her initial physical examination patient was febrile 103.2 F, tachycardic 130s beats per minute, blood pressure 96/66, her lungs were clear to auscultation bilaterally, heart S1-S2, pleasant, tachycardic, soft abdomen, tender to palpation at the lower quadrants and right upper quadrant, no lower extremity edema.  Sodium 131, potassium 3.2, chloride 98, bicarb 22, glucose 150, BUN 8, creatinine 0.78, white count 21.7, hemoglobin 13.1, hematocrit 40.5, platelets 279. SARS COVID-19 negative. Urinalysis specific rate of 1.017, 30 protein, 6-10 red cells, many bacteria, 11-20 white cells.  CT of the abdomen with enlarged heterogeneous right kidney with heterogeneous enhancement pattern. Heterogeneous enhancement of the uterus.  EKG 135 bpm, rightward axis, normal intervals, sinus rhythm, no ST segment or T wave changes.  Patient received broad-spectrum antibiotic therapy with improvement of her symptoms. Is  not completely clear if it is pelvic inflammatory disease or pyelonephritis, her response to antibiotics and clinical course may suggest more a pelvic inflammatory disease.  1. Sepsis due to pelvic plantar disease, possible urine tract infection/pyelonephritis. Patient received intravenous fluids, broad-spectrum antibiotic therapy with ceftriaxone, metronidazole and doxycycline. Her symptoms, including abdominal pain and fever resolved.  Her white cell count at discharge is 8.5.  Further work-up with pelvic ultrasound showed a heterogeneous appearance of the myometrium with prominent vascular flow on color Doppler imaging.  Findings are nonspecific and can be seen in the setting of pelvic congestion syndrome, particularly given the presence of prominent pelvic vasculature on both ultrasound and CT images.  Alternative etiology could include either a adenomyositis though the imaging fair is somewhat atypical in the absence of shadowing.  Her blood culture and urine culture remain no growth.   Wet prep vaginal with positive yeast, non seen trichomonas or clue cells, positive many white cells.  Patient did receive 1 dose of fluconazole with good toleration.  2.  Hyponatremia/hypokalemia/hypomagnesemia, hypovolemia.  Patient received intravenous fluids, and her electrolytes were corrected, she received potassium chloride and magnesium oxide.  Her discharge sodium was 139, potassium 4.0, chloride 105, bicarb 26, chloride 119, BUN 9, creatinine 0.61. She is tolerating p.o. diet adequately.  3. Vaginal yeast.  Status post 1 dose of fluconazole.  4. Mild transitory hypoglycemia.  Self resolved.  Discharge Diagnoses:  Principal Problem:   Sepsis [facility]) Active Problems:   Acute pyelonephritis   Vaginal discharge   Pelvic inflammatory disease    Discharge Instructions   Allergies as of 10/21/2020      Reactions   Sulfa Antibiotics Hives      Medication List    TAKE these medications    acetaminophen 325 MG  tablet Commonly known as: TYLENOL Take 2 tablets (650 mg total) by mouth every 6 (six) hours as needed for mild pain (or Fever >/= 101).   doxycycline 100 MG tablet Commonly known as: VIBRA-TABS Take 1 tablet (100 mg total) by mouth every 12 (twelve) hours for 12 days.   ibuprofen 200 MG tablet Commonly known as: ADVIL Take 600 mg by mouth every 6 (six) hours as needed (pain).   metroNIDAZOLE 500 MG tablet Commonly known as: FLAGYL Take 1 tablet (500 mg total) by mouth every 8 (eight) hours for 12 days.   pantoprazole 40 MG tablet Commonly known as: PROTONIX Take 1 tablet (40 mg total) by mouth daily for 14 days. Start taking on: October 22, 2020       Allergies  Allergen Reactions  . Sulfa Antibiotics Hives       Procedures/Studies: CT ABDOMEN PELVIS W CONTRAST  Result Date: 10/18/2020 CLINICAL DATA:  Abdominal pain and fever EXAM: CT ABDOMEN AND PELVIS WITH CONTRAST TECHNIQUE: Multidetector CT imaging of the abdomen and pelvis was performed using the standard protocol following bolus administration of intravenous contrast. CONTRAST:  135mL OMNIPAQUE IOHEXOL 300 MG/ML  SOLN COMPARISON:  None. FINDINGS: LOWER CHEST: Normal. HEPATOBILIARY: Normal hepatic contours. No intra- or extrahepatic biliary dilatation. The gallbladder is normal. PANCREAS: Normal pancreas. No ductal dilatation or peripancreatic fluid collection. SPLEEN: Normal. ADRENALS/URINARY TRACT: The adrenal glands are normal. Enlarged right kidney with heterogeneous enhancement pattern. The urinary bladder is normal for degree of distention STOMACH/BOWEL: There is no hiatal hernia. Normal duodenal course and caliber. No small bowel dilatation or inflammation. No focal colonic abnormality. Normal appendix. VASCULAR/LYMPHATIC: Normal course and caliber of the major abdominal vessels. No abdominal or pelvic lymphadenopathy. REPRODUCTIVE: Heterogeneous enhancement of the uterus. Left corpus luteum.  Small amount of free fluid in the posterior cul-de-sac. MUSCULOSKELETAL. No bony spinal canal stenosis or focal osseous abnormality. OTHER: None. IMPRESSION: 1. Enlarged, heterogeneous right kidney with heterogeneous enhancement pattern. Correlate with urinalysis results as this pattern may be seen in pyelonephritis. 2. Heterogeneous enhancement of the uterus, nonspecific. Consider correlation with pelvic ultrasound. Electronically Signed   By: Ulyses Jarred M.D.   On: 10/18/2020 00:17   US PELVIC COMPLETE W TRANSVAGINAL AND TORSION R/O  Result Date: 10/18/2020 CLINICAL DATA:  Pelvic pain, leukocytosis, abnormal CT EXAM: TRANSABDOMINAL AND TRANSVAGINAL ULTRASOUND OF PELVIS DOPPLER ULTRASOUND OF OVARIES TECHNIQUE: Both transabdominal and transvaginal ultrasound examinations of the pelvis were performed. Transabdominal technique was performed for global imaging of the pelvis including uterus, ovaries, adnexal regions, and pelvic cul-de-sac. It was necessary to proceed with endovaginal exam following the transabdominal exam to visualize the uterus, endometrium and ovaries. Color and duplex Doppler ultrasound was utilized to evaluate blood flow to the ovaries. COMPARISON:  CT 10/17/2020, obstetrical ultrasound 05/05/2020 FINDINGS: Uterus Measurements: 8.4 x 4.4 x 5.9 cm = volume: 115 mL. Myometrium is mildly thickened in fairly heterogenous with prominent vascular flow on color Doppler imaging. Such an appearance is quite nonspecific. Serpiginous appearance of some of the color flow may reflect prominent vascular channels. No significant shadowing. No focal concerning lesion is seen. Punctate echogenic subendometrial focus seen along the Endometrium Thickness: 9.5 mm,. Arcuate appearance of the fundus. No focal lesions. Right ovary Measurements: 3.8 x 2.7 x 2.7 cm = volume: 14.7 mL. Hypoechoic 2 cm focus with peripheral color Doppler flow, suggestive in involuting corpus luteum. Left ovary Measurements: 3.6 x 2.5 x  3.4 cm = volume: 15.7 mL. Thick-walled, centrally cystic focus measuring 1.8 cm  could reflect an additional corpus luteum or functional follicle. Pulsed Doppler evaluation of both ovaries demonstrates normal low-resistance arterial and venous waveforms. Other findings Small volume free fluid in the pelvis, nonspecific in a reproductive age female. Prominent pelvic vasculature is noted along the margins of the imaging with a similar appearance on CT imaging as well. IMPRESSION: 1. Heterogeneous appearance of the myometrium with prominent vascular flow on color Doppler imaging. Findings are nonspecific and can be seen in the setting of pelvic congestion syndrome, particularly given the presence of prominent pelvic vasculature on both ultrasound and CT images. Alternative etiology could include adenomyosis though the imaging fair is somewhat atypical in the absence of shadowing. 2. Probable involuting corpus luteum in the right ovary. Thick-walled, centrally cystic focus in the left ovary may reflect an additional corpus luteum or functional follicle. No followup imaging recommended. Note: This recommendation does not apply to premenarchal patients or to those with increased risk (genetic, family history, elevated tumor markers or other high-risk factors) of ovarian cancer. Reference: Radiology 2019 Nov; 293(2):359-371. 3. Small volume free fluid in the pelvis, nonspecific and often physiologic in a reproductive age female. Electronically Signed   By: Lovena Le M.D.   On: 10/18/2020 02:33        Subjective: Patient is feeling better, no further abdominal pain, no nausea or vomiting, no chest pain or dyspnea, no further vaginal discharge,   Discharge Exam: Vitals:   10/21/20 0622 10/21/20 0804  BP: 104/66 108/71  Pulse: 91 88  Resp: 20 18  Temp: 98.5 F (36.9 C) 98 F (36.7 C)  SpO2: 100% 100%   Vitals:   10/21/20 0210 10/21/20 0335 10/21/20 0622 10/21/20 0804  BP:   104/66 108/71  Pulse:   91  88  Resp:   20 18  Temp: 98.6 F (37 C) 99.1 F (37.3 C) 98.5 F (36.9 C) 98 F (36.7 C)  TempSrc: Oral Oral Oral Oral  SpO2:   100% 100%  Weight:   55 kg   Height:        General: Not in pain or dyspnea,  Neurology: Awake and alert, non focal  E ENT: no pallor, no icterus, oral mucosa moist Cardiovascular: No JVD. S1-S2 present, rhythmic, no gallops, rubs, or murmurs. No lower extremity edema. Pulmonary: vesicular breath sounds bilaterally, no wheezing.  Gastrointestinal. Abdomen soft and non tender Skin. No rashes Musculoskeletal: no joint deformities   The results of significant diagnostics from this hospitalization (including imaging, microbiology, ancillary and laboratory) are listed below for reference.     Microbiology: Recent Results (from the past 240 hour(s))  Wet prep, genital     Status: Abnormal   Collection Time: 10/17/20 11:34 PM  Result Value Ref Range Status   Yeast Wet Prep HPF POC PRESENT (A) NONE SEEN Final   Trich, Wet Prep NONE SEEN NONE SEEN Final   Clue Cells Wet Prep HPF POC NONE SEEN NONE SEEN Final   WBC, Wet Prep HPF POC MANY (A) NONE SEEN Final   Sperm NONE SEEN  Final    Comment: Performed at [facility], [place] [address], [place], [place] [address]  Resp Panel by RT-PCR (Flu A&B, Covid) Nasopharyngeal Swab     Status: None   Collection Time: 10/18/20 12:21 AM   Specimen: Nasopharyngeal Swab; Nasopharyngeal(NP) swabs in vial transport medium  Result Value Ref Range Status   SARS Coronavirus 2 by RT PCR NEGATIVE NEGATIVE Final    Comment: (NOTE) SARS-CoV-2 target nucleic  acids are NOT DETECTED.  The SARS-CoV-2 RNA is generally detectable in upper respiratory specimens during the acute phase of infection. The lowest concentration of SARS-CoV-2 viral copies this assay can detect is 138 copies/mL. A negative result does not preclude SARS-Cov-2 infection and should not be used as the sole basis for treatment or other patient management  decisions. A negative result may occur with  improper specimen collection/handling, submission of specimen other than nasopharyngeal swab, presence of viral mutation(s) within the areas targeted by this assay, and inadequate number of viral copies(<138 copies/mL). A negative result must be combined with clinical observations, patient history, and epidemiological information. The expected result is Negative.  Fact Sheet for Patients:  EntrepreneurPulse.com.au  Fact Sheet for Healthcare Providers:  IncredibleEmployment.be  This test is no t yet approved or cleared by the [place] FDA and  has been authorized for detection and/or diagnosis of SARS-CoV-2 by FDA under an Emergency Use Authorization (EUA). This EUA will remain  in effect (meaning this test can be used) for the duration of the COVID-19 declaration under Section 564(b)(1) of the Act, 21 U.S.C.section 360bbb-3(b)(1), unless the authorization is terminated  or revoked sooner.       Influenza A by PCR NEGATIVE NEGATIVE Final   Influenza B by PCR NEGATIVE NEGATIVE Final    Comment: (NOTE) The Xpert Xpress SARS-CoV-2/FLU/RSV plus assay is intended as an aid in the diagnosis of influenza from Nasopharyngeal swab specimens and should not be used as a sole basis for treatment. Nasal washings and aspirates are unacceptable for Xpert Xpress SARS-CoV-2/FLU/RSV testing.  Fact Sheet for Patients: EntrepreneurPulse.com.au  Fact Sheet for Healthcare Providers: IncredibleEmployment.be  This test is not yet approved or cleared by the [place] FDA and has been authorized for detection and/or diagnosis of SARS-CoV-2 by FDA under an Emergency Use Authorization (EUA). This EUA will remain in effect (meaning this test can be used) for the duration of the COVID-19 declaration under Section 564(b)(1) of the Act, 21 U.S.C. section 360bbb-3(b)(1), unless the  authorization is terminated or revoked.  Performed at [facility], [place] [address], [place], [place] [address]   Culture, blood (routine x 2)     Status: None (Preliminary result)   Collection Time: 10/18/20  2:56 AM   Specimen: BLOOD  Result Value Ref Range Status   Specimen Description BLOOD LEFT ANTECUBITAL  Final   Special Requests   Final    BOTTLES DRAWN AEROBIC AND ANAEROBIC Blood Culture results may not be optimal due to an inadequate volume of blood received in culture bottles   Culture   Final    NO GROWTH 3 DAYS Performed at [facility], [place] [address], [place], [place] [address]    Report Status PENDING  Incomplete  Culture, blood (routine x 2)     Status: None (Preliminary result)   Collection Time: 10/18/20  2:57 AM   Specimen: BLOOD  Result Value Ref Range Status   Specimen Description BLOOD RIGHT ANTECUBITAL  Final   Special Requests   Final    BOTTLES DRAWN AEROBIC AND ANAEROBIC Blood Culture results may not be optimal due to an inadequate volume of blood received in culture bottles   Culture   Final    NO GROWTH 3 DAYS Performed at [facility], [place] [address] [place],  [address]    Report Status PENDING  Incomplete  Culture, Urine     Status: Abnormal   Collection Time: 10/18/20  4:55 AM  Specimen: Urine, Random  Result Value Ref Range Status   Specimen Description URINE, RANDOM  Final   Special Requests   Final    NONE Performed at [facility], [address], [place], [place] [address]    Culture MULTIPLE SPECIES PRESENT, SUGGEST RECOLLECTION (A)  Final   Report Status 10/19/2020 FINAL  Final  Culture, Urine     Status: None   Collection Time: 10/19/20  6:50 PM   Specimen: Urine, Clean Catch  Result Value Ref Range Status   Specimen Description URINE, CLEAN CATCH  Final   Special Requests NONE  Final   Culture   Final    NO GROWTH Performed at [facility], [place] [address], [place], [place] [address]     Report Status 10/21/2020 FINAL  Final     Labs: BNP (last 3 results) No results for input(s): BNP in the last 8760 hours. Basic Metabolic Panel: Recent Labs  Lab 10/17/20 1531 10/18/20 0242 10/20/20 0230  NA 131* 135 139  K 3.2* 3.7 4.0  CL 98 105 105  CO2 22 21* 26  GLUCOSE 150* 121* 119*  BUN 8 6 5*  CREATININE 0.78 0.72 0.61  CALCIUM 8.7* 7.2* 7.9*  MG  --  1.5*  --    Liver Function Tests: Recent Labs  Lab 10/17/20 1531  AST 16  ALT 14  ALKPHOS 55  BILITOT 0.9  PROT 6.8  ALBUMIN 3.7   Recent Labs  Lab 10/17/20 1531  LIPASE 21   No results for input(s): AMMONIA in the last 168 hours. CBC: Recent Labs  Lab 10/17/20 1531 10/18/20 0242 10/19/20 1451 10/20/20 1533  WBC 21.7* 17.8* 14.7* 8.5  NEUTROABS  --   --  11.7* 6.9  HGB 13.1 11.4* 10.8* 10.6*  HCT 40.5 36.1 31.0* 32.9*  MCV 90.0 91.9 87.6 89.6  PLT 279 197 199 238   Cardiac Enzymes: No results for input(s): CKTOTAL, CKMB, CKMBINDEX, TROPONINI in the last 168 hours. BNP: Invalid input(s): POCBNP CBG: Recent Labs  Lab 10/18/20 1152 10/18/20 1659 10/19/20 0749 10/19/20 1211 10/19/20 1543  GLUCAP 113* 107* 98 89 124*   D-Dimer No results for input(s): DDIMER in the last 72 hours. Hgb A1c No results for input(s): HGBA1C in the last 72 hours. Lipid Profile No results for input(s): CHOL, HDL, LDLCALC, TRIG, CHOLHDL, LDLDIRECT in the last 72 hours. Thyroid function studies No results for input(s): TSH, T4TOTAL, T3FREE, THYROIDAB in the last 72 hours.  Invalid input(s): FREET3 Anemia work up No results for input(s): VITAMINB12, FOLATE, FERRITIN, TIBC, IRON, RETICCTPCT in the last 72 hours. Urinalysis    Component Value Date/Time   COLORURINE YELLOW 10/17/2020 1531   APPEARANCEUR HAZY (A) 10/17/2020 1531   LABSPEC 1.017 10/17/2020 1531   PHURINE 5.0 10/17/2020 1531   GLUCOSEU NEGATIVE 10/17/2020 1531   HGBUR LARGE (A) 10/17/2020 1531   BILIRUBINUR NEGATIVE 10/17/2020 1531    BILIRUBINUR negative 07/25/2015 1754   BILIRUBINUR neg 01/28/2015 1906   KETONESUR 20 (A) 10/17/2020 1531   PROTEINUR 30 (A) 10/17/2020 1531   UROBILINOGEN 0.2 01/26/2017 1351   NITRITE NEGATIVE 10/17/2020 1531   LEUKOCYTESUR SMALL (A) 10/17/2020 1531   Sepsis Labs Invalid input(s): PROCALCITONIN,  WBC,  LACTICIDVEN Microbiology Recent Results (from the past 240 hour(s))  Wet prep, genital     Status: Abnormal   Collection Time: 10/17/20 11:34 PM  Result Value Ref Range Status   Yeast Wet Prep HPF POC PRESENT (A) NONE SEEN Final  Trich, Wet Prep NONE SEEN NONE SEEN Final   Clue Cells Wet Prep HPF POC NONE SEEN NONE SEEN Final   WBC, Wet Prep HPF POC MANY (A) NONE SEEN Final   Sperm NONE SEEN  Final    Comment: Performed at [facility], [place] [address], [place], [place] [address]  Resp Panel by RT-PCR (Flu A&B, Covid) Nasopharyngeal Swab     Status: None   Collection Time: 10/18/20 12:21 AM   Specimen: Nasopharyngeal Swab; Nasopharyngeal(NP) swabs in vial transport medium  Result Value Ref Range Status   SARS Coronavirus 2 by RT PCR NEGATIVE NEGATIVE Final    Comment: (NOTE) SARS-CoV-2 target nucleic acids are NOT DETECTED.  The SARS-CoV-2 RNA is generally detectable in upper respiratory specimens during the acute phase of infection. The lowest concentration of SARS-CoV-2 viral copies this assay can detect is 138 copies/mL. A negative result does not preclude SARS-Cov-2 infection and should not be used as the sole basis for treatment or other patient management decisions. A negative result may occur with  improper specimen collection/handling, submission of specimen other than nasopharyngeal swab, presence of viral mutation(s) within the areas targeted by this assay, and inadequate number of viral copies(<138 copies/mL). A negative result must be combined with clinical observations, patient history, and epidemiological information. The expected result is  Negative.  Fact Sheet for Patients:  EntrepreneurPulse.com.au  Fact Sheet for Healthcare Providers:  IncredibleEmployment.be  This test is no t yet approved or cleared by the [place] FDA and  has been authorized for detection and/or diagnosis of SARS-CoV-2 by FDA under an Emergency Use Authorization (EUA). This EUA will remain  in effect (meaning this test can be used) for the duration of the COVID-19 declaration under Section 564(b)(1) of the Act, 21 U.S.C.section 360bbb-3(b)(1), unless the authorization is terminated  or revoked sooner.       Influenza A by PCR NEGATIVE NEGATIVE Final   Influenza B by PCR NEGATIVE NEGATIVE Final    Comment: (NOTE) The Xpert Xpress SARS-CoV-2/FLU/RSV plus assay is intended as an aid in the diagnosis of influenza from Nasopharyngeal swab specimens and should not be used as a sole basis for treatment. Nasal washings and aspirates are unacceptable for Xpert Xpress SARS-CoV-2/FLU/RSV testing.  Fact Sheet for Patients: EntrepreneurPulse.com.au  Fact Sheet for Healthcare Providers: IncredibleEmployment.be  This test is not yet approved or cleared by the [place] FDA and has been authorized for detection and/or diagnosis of SARS-CoV-2 by FDA under an Emergency Use Authorization (EUA). This EUA will remain in effect (meaning this test can be used) for the duration of the COVID-19 declaration under Section 564(b)(1) of the Act, 21 U.S.C. section 360bbb-3(b)(1), unless the authorization is terminated or revoked.  Performed at [facility], [place] [address] [place], [place] [address]   Culture, blood (routine x 2)     Status: None (Preliminary result)   Collection Time: 10/18/20  2:56 AM   Specimen: BLOOD  Result Value Ref Range Status   Specimen Description BLOOD LEFT ANTECUBITAL  Final   Special Requests   Final    BOTTLES DRAWN AEROBIC AND ANAEROBIC Blood  Culture results may not be optimal due to an inadequate volume of blood received in culture bottles   Culture   Final    NO GROWTH 3 DAYS Performed at [facility], [place] [address] [place], [place] [address]    Report Status PENDING  Incomplete  Culture, blood (routine x 2)  Status: None (Preliminary result)   Collection Time: 10/18/20  2:57 AM   Specimen: BLOOD  Result Value Ref Range Status   Specimen Description BLOOD RIGHT ANTECUBITAL  Final   Special Requests   Final    BOTTLES DRAWN AEROBIC AND ANAEROBIC Blood Culture results may not be optimal due to an inadequate volume of blood received in culture bottles   Culture   Final    NO GROWTH 3 DAYS Performed at [facility], [place] [address], [place], [place] [address]    Report Status PENDING  Incomplete  Culture, Urine     Status: Abnormal   Collection Time: 10/18/20  4:55 AM   Specimen: Urine, Random  Result Value Ref Range Status   Specimen Description URINE, RANDOM  Final   Special Requests   Final    NONE Performed at [facility], [place] [address] [place], [place] [address]    Culture MULTIPLE SPECIES PRESENT, SUGGEST RECOLLECTION (A)  Final   Report Status 10/19/2020 FINAL  Final  Culture, Urine     Status: None   Collection Time: 10/19/20  6:50 PM   Specimen: Urine, Clean Catch  Result Value Ref Range Status   Specimen Description URINE, CLEAN CATCH  Final   Special Requests NONE  Final   Culture   Final    NO GROWTH Performed at [facility], [place] [address], [place],  [address]    Report Status 10/21/2020 FINAL  Final     Time coordinating discharge: 45 minutes  SIGNED:   Tawni Millers, MD  [facility] 10/21/2020, 10:12 AM

## 2020-10-23 LAB — CULTURE, BLOOD (ROUTINE X 2)
Culture: NO GROWTH
Culture: NO GROWTH

## 2020-10-31 ENCOUNTER — Other Ambulatory Visit: Payer: Self-pay

## 2020-10-31 ENCOUNTER — Encounter: Payer: Self-pay | Admitting: Family Medicine

## 2020-10-31 ENCOUNTER — Ambulatory Visit: Payer: BC Managed Care – PPO | Admitting: Family Medicine

## 2020-10-31 VITALS — BP 104/60 | HR 88 | Temp 97.4°F | Wt 112.0 lb

## 2020-10-31 DIAGNOSIS — R519 Headache, unspecified: Secondary | ICD-10-CM | POA: Diagnosis not present

## 2020-10-31 DIAGNOSIS — N10 Acute tubulo-interstitial nephritis: Secondary | ICD-10-CM | POA: Diagnosis not present

## 2020-10-31 DIAGNOSIS — R195 Other fecal abnormalities: Secondary | ICD-10-CM

## 2020-10-31 DIAGNOSIS — R109 Unspecified abdominal pain: Secondary | ICD-10-CM | POA: Diagnosis not present

## 2020-10-31 LAB — POCT URINALYSIS DIP (PROADVANTAGE DEVICE)
Bilirubin, UA: NEGATIVE
Glucose, UA: NEGATIVE mg/dL
Ketones, POC UA: NEGATIVE mg/dL
Nitrite, UA: NEGATIVE
Protein Ur, POC: NEGATIVE mg/dL
Specific Gravity, Urine: 1.015
Urobilinogen, Ur: NEGATIVE
pH, UA: 6 (ref 5.0–8.0)

## 2020-10-31 NOTE — Patient Instructions (Signed)
Try to increase your water intake to 64 ounces throughout the day. Your urine should be a very light yellow to almost clear to suggest you are well hydrated.   Follow up with any new or concerning symptoms.

## 2020-10-31 NOTE — Progress Notes (Signed)
   Subjective:    Patient ID: Jennifer Randall, female    DOB: January 21, 1991, 30 y.o.   MRN: [id number]  HPI Chief Complaint  Patient presents with  . Hospitalization Follow-up    Hospital follow-up. Pain with urination. Complains of color and odor in urine. On strong antibiotics and stomach doesn't feel well.    She is new to the practice and here to establish care.   Other providers: OB/GYN- [facility]   She was recently hospitalized for sepsis related to acute pyelonephritis and questionable PID.  She reports feeling much better but has been tired today. 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   States she called her OB/GYN to schedule a hospital follow up but states she was told her provider reviewed her [place] and CT and that she did not need to come in for a visit.   States she is still on antibiotics but did not keep down them down this morning. States she took them with apple juice. She usually does not like to eat anything substantial for breakfast.  Reports she usually drinks 30 ounces of water or so for the entire day.   She is taking Protonix to help her stomach.   She is having some right lower back pain without radiation.  Mild headache and diarrhea today as well. States she had 2 large bowel movements this morning and they were softer than usual.   Denies fever, chills, chest pain, shortness of breath, abdominal pain, suprapubic pressure or pain.   She is not taking any pain medications now.   States she thinks she has anxiety about her health now.   LMP: last week   Married. June 2020. 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    Review of Systems Pertinent positives and negatives in the history of present illness.     Objective:   Physical Exam BP 104/60   Pulse 88   Temp (!) 97.4 F (36.3 C)   Wt 112 lb (50.8 kg)    LMP 03/17/2020   Breastfeeding No   BMI 19.84 kg/m   Alert and in no distress. Cardiac exam shows a regular sinus rhythm without murmurs or gallops. Lungs are clear to auscultation. Abdomen is soft, non distended, normal BS, TTP to bilateral lower abdomen without rebound. Extremities without edema. Skin is warm and dry.        Assessment & Plan:  Flank pain - Plan: POCT Urinalysis DIP (Proadvantage Device)  Acute pyelonephritis  Acute nonintractable headache, unspecified headache type  Loose stools  She is new to me today.  Reviewed hospital discharge summary and all labs and radiology studies.  She appears to be doing much better now but she did have some mild symptoms today while back at work.  She will complete the antibiotic and I recommend eating a meal with her morning dose tomorrow. I also recommend increasing her water intake to 64 ounces per day.  Advised to follow up with her OB/GYN if she has any worsening symptoms of pelvic pain or any new symptoms.  May follow up here as well as needed.

## 2021-02-08 ENCOUNTER — Encounter: Payer: Self-pay | Admitting: Family Medicine

## 2021-02-08 ENCOUNTER — Ambulatory Visit: Payer: BC Managed Care – PPO | Admitting: Family Medicine

## 2021-02-08 ENCOUNTER — Other Ambulatory Visit: Payer: Self-pay

## 2021-02-08 VITALS — BP 110/70 | HR 78 | Temp 97.8°F | Wt 110.2 lb

## 2021-02-08 DIAGNOSIS — R3915 Urgency of urination: Secondary | ICD-10-CM | POA: Diagnosis not present

## 2021-02-08 DIAGNOSIS — N3001 Acute cystitis with hematuria: Secondary | ICD-10-CM | POA: Diagnosis not present

## 2021-02-08 DIAGNOSIS — Z87448 Personal history of other diseases of urinary system: Secondary | ICD-10-CM

## 2021-02-08 DIAGNOSIS — Z636 Dependent relative needing care at home: Secondary | ICD-10-CM

## 2021-02-08 LAB — POCT URINALYSIS DIP (PROADVANTAGE DEVICE)
Bilirubin, UA: NEGATIVE
Glucose, UA: NEGATIVE mg/dL
Ketones, POC UA: NEGATIVE mg/dL
Leukocytes, UA: NEGATIVE
Nitrite, UA: POSITIVE — AB
Protein Ur, POC: NEGATIVE mg/dL
Specific Gravity, Urine: 1.015
Urobilinogen, Ur: NEGATIVE
pH, UA: 6 (ref 5.0–8.0)

## 2021-02-08 MED ORDER — NITROFURANTOIN MONOHYD MACRO 100 MG PO CAPS: 100.0000 mg | ORAL_CAPSULE | Freq: Two times a day (BID) | ORAL | 0 refills | Status: DC

## 2021-02-08 NOTE — Progress Notes (Signed)
Subjective: Chief Complaint  Patient presents with  . possible UTI    Possible UTI- smell, urgency, cloudy. X 1 week ago     Jennifer Randall is a 30 y.o. female who complains of possible urinary tract infection.  She has had symptoms for 1 week.  Symptoms include malodorous and dark urine, urgency and frequency. Patient denies fever, chills, abdominal pain, flank pain, N/V/D.  Last UTI was January.   Using nothing for current symptoms.    Patient does have a history of recurrent UTI. Patient does have a history of pyelonephritis.  No other aggravating or relieving factors.  No other c/o.  Past Medical History:  Diagnosis Date  . Depression   . Heart murmur     ROS as in subjective  Reviewed allergies, medications, past medical, surgical, and social history.    Objective: Vitals:   02/08/21 1332  BP: 110/70  Pulse: 78  Temp: 97.8 F (36.6 C)    General appearance: alert, no distress, WD/WN, female Abdomen: +bs, soft, non tender, non distended Back: no CVA tenderness GU: declines      Laboratory:  Urine dipstick: pos for nitrites an d blood.      Assessment: Acute cystitis with hematuria - Plan: nitrofurantoin, macrocrystal-monohydrate, (MACROBID) 100 MG capsule, Urine Culture, POCT Urinalysis DIP (Proadvantage Device)  Urgency of urination - Plan: POCT Urinalysis DIP (Proadvantage Device)  History of pyelonephritis - Plan: Urine Culture, POCT Urinalysis DIP (Proadvantage Device)  Caregiver stress    Plan: Discussed symptoms, diagnosis, possible complications, and usual course of illness.  Begin Macrobid.   Advised increased water intake, can use OTC Tylenol for pain.    Advised to return for a repeat UA in 2 weeks.   Urine culture sent.   Call or return if worse or not improving.

## 2021-02-14 LAB — URINE CULTURE

## 2021-02-18 ENCOUNTER — Encounter: Payer: Self-pay | Admitting: Family Medicine

## 2021-02-22 ENCOUNTER — Other Ambulatory Visit: Payer: Self-pay

## 2021-02-22 ENCOUNTER — Other Ambulatory Visit: Payer: Self-pay | Admitting: Internal Medicine

## 2021-02-22 ENCOUNTER — Other Ambulatory Visit (INDEPENDENT_AMBULATORY_CARE_PROVIDER_SITE_OTHER): Payer: BC Managed Care – PPO

## 2021-02-22 DIAGNOSIS — Z3201 Encounter for pregnancy test, result positive: Secondary | ICD-10-CM

## 2021-02-22 DIAGNOSIS — N3001 Acute cystitis with hematuria: Secondary | ICD-10-CM

## 2021-02-22 DIAGNOSIS — Z87448 Personal history of other diseases of urinary system: Secondary | ICD-10-CM

## 2021-02-22 LAB — POCT URINALYSIS DIP (PROADVANTAGE DEVICE)
Bilirubin, UA: NEGATIVE
Blood, UA: NEGATIVE
Glucose, UA: NEGATIVE mg/dL
Ketones, POC UA: NEGATIVE mg/dL
Leukocytes, UA: NEGATIVE
Nitrite, UA: NEGATIVE
Protein Ur, POC: NEGATIVE mg/dL
Specific Gravity, Urine: 1.015
Urobilinogen, Ur: NEGATIVE
pH, UA: 6 (ref 5.0–8.0)

## 2021-02-22 LAB — POCT URINE PREGNANCY: Preg Test, Ur: POSITIVE — AB

## 2021-03-15 ENCOUNTER — Encounter: Payer: Self-pay | Admitting: Internal Medicine

## 2021-03-22 ENCOUNTER — Ambulatory Visit (HOSPITAL_COMMUNITY)
Admission: RE | Admit: 2021-03-22 | Discharge: 2021-03-22 | Disposition: A | Discharge status: Home / Self Care | Payer: BC Managed Care – PPO | Source: Ambulatory Visit | Attending: Obstetrics | Admitting: Obstetrics

## 2021-03-22 ENCOUNTER — Encounter (HOSPITAL_COMMUNITY)
Admission: RE | Disposition: A | Discharge status: Home / Self Care | Payer: Self-pay | Source: Ambulatory Visit | Attending: Obstetrics

## 2021-03-22 ENCOUNTER — Ambulatory Visit (HOSPITAL_COMMUNITY): Payer: BC Managed Care – PPO | Admitting: Certified Registered Nurse Anesthetist

## 2021-03-22 ENCOUNTER — Other Ambulatory Visit: Payer: Self-pay | Admitting: Obstetrics and Gynecology

## 2021-03-22 ENCOUNTER — Encounter (HOSPITAL_COMMUNITY): Payer: Self-pay | Admitting: Obstetrics

## 2021-03-22 ENCOUNTER — Other Ambulatory Visit: Payer: Self-pay

## 2021-03-22 DIAGNOSIS — O021 Missed abortion: Secondary | ICD-10-CM | POA: Insufficient documentation

## 2021-03-22 DIAGNOSIS — Z3A09 9 weeks gestation of pregnancy: Secondary | ICD-10-CM | POA: Diagnosis not present

## 2021-03-22 DIAGNOSIS — O26891 Other specified pregnancy related conditions, first trimester: Secondary | ICD-10-CM | POA: Diagnosis present

## 2021-03-22 DIAGNOSIS — O3680X0 Pregnancy with inconclusive fetal viability, not applicable or unspecified: Secondary | ICD-10-CM

## 2021-03-22 HISTORY — PX: DILATION AND EVACUATION: SHX1459

## 2021-03-22 LAB — CBC
HCT: 40.3 % (ref 36.0–46.0)
Hemoglobin: 13.3 g/dL (ref 12.0–15.0)
MCH: 29.8 pg (ref 26.0–34.0)
MCHC: 33 g/dL (ref 30.0–36.0)
MCV: 90.4 fL (ref 80.0–100.0)
Platelets: 262 10*3/uL (ref 150–400)
RBC: 4.46 MIL/uL (ref 3.87–5.11)
RDW: 12.6 % (ref 11.5–15.5)
WBC: 8.8 10*3/uL (ref 4.0–10.5)
nRBC: 0 % (ref 0.0–0.2)

## 2021-03-22 LAB — COMPREHENSIVE METABOLIC PANEL
ALT: 15 U/L (ref 0–44)
AST: 13 U/L — ABNORMAL LOW (ref 15–41)
Albumin: 3.8 g/dL (ref 3.5–5.0)
Alkaline Phosphatase: 40 U/L (ref 38–126)
Anion gap: 7 (ref 5–15)
BUN: 5 mg/dL — ABNORMAL LOW (ref 6–20)
CO2: 26 mmol/L (ref 22–32)
Calcium: 8.5 mg/dL — ABNORMAL LOW (ref 8.9–10.3)
Chloride: 105 mmol/L (ref 98–111)
Creatinine, Ser: 0.48 mg/dL (ref 0.44–1.00)
GFR, Estimated: >60 mL/min (ref >60)
Glucose, Bld: 95 mg/dL (ref 70–99)
Potassium: 3.5 mmol/L (ref 3.5–5.1)
Sodium: 138 mmol/L (ref 135–145)
Total Bilirubin: 0.7 mg/dL (ref 0.3–1.2)
Total Protein: 6 g/dL — ABNORMAL LOW (ref 6.5–8.1)

## 2021-03-22 LAB — TYPE AND SCREEN
ABO/RH(D): O POS
Antibody Screen: NEGATIVE

## 2021-03-22 SURGERY — DILATION AND EVACUATION, UTERUS
Anesthesia: General

## 2021-03-22 MED ORDER — POVIDONE-IODINE 10 % EX SWAB
2.0000 "application " | Freq: Once | CUTANEOUS | Status: AC
  Administered 2021-03-22: 2 via TOPICAL

## 2021-03-22 MED ORDER — LIDOCAINE-EPINEPHRINE (PF) 1 %-1:200000 IJ SOLN
INTRAMUSCULAR | Status: AC
  Filled 2021-03-22: qty 30

## 2021-03-22 MED ORDER — PHENYLEPHRINE HCL-NACL 10-0.9 MG/250ML-% IV SOLN
INTRAVENOUS | Status: DC | PRN
  Administered 2021-03-22: 20 ug/min via INTRAVENOUS

## 2021-03-22 MED ORDER — PROPOFOL 10 MG/ML IV BOLUS
INTRAVENOUS | Status: DC | PRN
  Administered 2021-03-22: 170 mg via INTRAVENOUS

## 2021-03-22 MED ORDER — LACTATED RINGERS IV SOLN: INTRAVENOUS | Status: DC

## 2021-03-22 MED ORDER — PHENYLEPHRINE 40 MCG/ML (10ML) SYRINGE FOR IV PUSH (FOR BLOOD PRESSURE SUPPORT)
PREFILLED_SYRINGE | INTRAVENOUS | Status: DC | PRN
  Administered 2021-03-22: 80 ug via INTRAVENOUS
  Administered 2021-03-22: 120 ug via INTRAVENOUS
  Administered 2021-03-22: 80 ug via INTRAVENOUS

## 2021-03-22 MED ORDER — MIDAZOLAM HCL 2 MG/2ML IJ SOLN
INTRAMUSCULAR | Status: AC
  Filled 2021-03-22: qty 2

## 2021-03-22 MED ORDER — KETOROLAC TROMETHAMINE 30 MG/ML IJ SOLN
INTRAMUSCULAR | Status: DC | PRN
  Administered 2021-03-22: 30 mg via INTRAVENOUS

## 2021-03-22 MED ORDER — SOD CITRATE-CITRIC ACID 500-334 MG/5ML PO SOLN
30.0000 mL | ORAL | Status: AC
  Administered 2021-03-22: 30 mL via ORAL

## 2021-03-22 MED ORDER — DEXAMETHASONE SODIUM PHOSPHATE 10 MG/ML IJ SOLN
INTRAMUSCULAR | Status: DC | PRN
  Administered 2021-03-22: 5 mg via INTRAVENOUS

## 2021-03-22 MED ORDER — ONDANSETRON HCL 4 MG/2ML IJ SOLN
INTRAMUSCULAR | Status: DC | PRN
  Administered 2021-03-22: 4 mg via INTRAVENOUS

## 2021-03-22 MED ORDER — CHLORHEXIDINE GLUCONATE 0.12 % MT SOLN
15.0000 mL | Freq: Once | OROMUCOSAL | Status: AC
  Administered 2021-03-22: 15 mL via OROMUCOSAL
  Filled 2021-03-22: qty 15

## 2021-03-22 MED ORDER — SODIUM CHLORIDE 0.9 % IR SOLN
Status: DC | PRN
  Administered 2021-03-22: 1000 mL

## 2021-03-22 MED ORDER — MIDAZOLAM HCL 2 MG/2ML IJ SOLN
INTRAMUSCULAR | Status: DC | PRN
  Administered 2021-03-22: 2 mg via INTRAVENOUS

## 2021-03-22 MED ORDER — LIDOCAINE 2% (20 MG/ML) 5 ML SYRINGE
INTRAMUSCULAR | Status: DC | PRN
  Administered 2021-03-22: 60 mg via INTRAVENOUS

## 2021-03-22 MED ORDER — PROPOFOL 10 MG/ML IV BOLUS
INTRAVENOUS | Status: AC
  Filled 2021-03-22: qty 20

## 2021-03-22 MED ORDER — LIDOCAINE HCL (PF) 1 % IJ SOLN
INTRAMUSCULAR | Status: AC
  Filled 2021-03-22: qty 30

## 2021-03-22 MED ORDER — FENTANYL CITRATE (PF) 100 MCG/2ML IJ SOLN: 25.0000 ug | INTRAMUSCULAR | Status: DC | PRN

## 2021-03-22 MED ORDER — FENTANYL CITRATE (PF) 250 MCG/5ML IJ SOLN
INTRAMUSCULAR | Status: DC | PRN
  Administered 2021-03-22: 75 ug via INTRAVENOUS
  Administered 2021-03-22: 25 ug via INTRAVENOUS

## 2021-03-22 MED ORDER — ORAL CARE MOUTH RINSE: 15.0000 mL | Freq: Once | OROMUCOSAL | Status: AC

## 2021-03-22 MED ORDER — DOXYCYCLINE HYCLATE 100 MG IV SOLR
200.0000 mg | Freq: Once | INTRAVENOUS | Status: AC
  Administered 2021-03-22: 200 mg via INTRAVENOUS
  Filled 2021-03-22: qty 200

## 2021-03-22 MED ORDER — FENTANYL CITRATE (PF) 250 MCG/5ML IJ SOLN
INTRAMUSCULAR | Status: AC
  Filled 2021-03-22: qty 5

## 2021-03-22 SURGICAL SUPPLY — 23 items
CATH ROBINSON RED A/P 16FR (CATHETERS) ×3 IMPLANT
DECANTER SPIKE VIAL GLASS SM (MISCELLANEOUS) ×3 IMPLANT
DILATOR CANAL MILEX (MISCELLANEOUS) IMPLANT
FILTER UTR ASPR ASSEMBLY (MISCELLANEOUS) ×3 IMPLANT
GLOVE ECLIPSE 6.0 STRL STRAW (GLOVE) ×3 IMPLANT
GLOVE SURG UNDER POLY LF SZ7 (GLOVE) ×3 IMPLANT
GOWN STRL REUS W/ TWL LRG LVL3 (GOWN DISPOSABLE) ×2 IMPLANT
GOWN STRL REUS W/TWL LRG LVL3 (GOWN DISPOSABLE) ×4
HOSE CONNECTING 18IN BERKELEY (TUBING) ×3 IMPLANT
KIT BERKELEY 1ST TRI 3/8 NO TR (MISCELLANEOUS) ×3 IMPLANT
KIT BERKELEY 1ST TRIMESTER 3/8 (MISCELLANEOUS) ×3 IMPLANT
NS IRRIG 1000ML POUR BTL (IV SOLUTION) ×3 IMPLANT
PACK VAGINAL MINOR WOMEN LF (CUSTOM PROCEDURE TRAY) ×3 IMPLANT
PAD OB MATERNITY 4.3X12.25 (PERSONAL CARE ITEMS) ×3 IMPLANT
SET BERKELEY SUCTION TUBING (SUCTIONS) ×3 IMPLANT
SPECIMEN JAR MEDIUM (MISCELLANEOUS) IMPLANT
SYR CONTROL 10ML LL (SYRINGE) ×3 IMPLANT
TOWEL GREEN STERILE FF (TOWEL DISPOSABLE) ×6 IMPLANT
UNDERPAD 30X36 HEAVY ABSORB (UNDERPADS AND DIAPERS) ×3 IMPLANT
VACURETTE 10 RIGID CVD (CANNULA) IMPLANT
VACURETTE 7MM CVD STRL WRAP (CANNULA) IMPLANT
VACURETTE 8 RIGID CVD (CANNULA) IMPLANT
VACURETTE 9 RIGID CVD (CANNULA) IMPLANT

## 2021-03-22 NOTE — Op Note (Signed)
Pre-Operative Diagnosis: Abnormal pregnancy of unknown location  Postoperative Diagnosis: Abnormal pregnancy of unknown location  Procedure: Dilation and curettage  Surgeon: Jerelyn Charles, MD  Operative Findings: Anteverted uterus, 8 week size  Specimen: Uterine contents, possible products of conception  EBL: 25 cc    Indication: Patient presented with abnormally rising bhcg in the setting of a pregnancy of unknown location.  She is 9 weeks by LMP.  She had an ultrasound on 6/7 and again 6/14 which showed an empty gestational sac, normal adnexa, no free fluid.  She had mild pelvic pain in the office today, so her primary obstetrician recommended a dilation and curettage.  She does not have a surgical abdomen or any evidence of ectopic pregnancy on ultrasound.  If pathology does not confirm an intrauterine pregnancy, will proceed with methotrexate   Procedure:   After adequate anesthesia was achieved, the patient placed in the dorsal lithotomy position in [place].  She was prepped and draped in the usual sterile fashion.  The bivalve speculum was placed in the vagina and the anterior lip of the cervix grasped with a single-tooth tenaculum.   The cervix was serially dilated with Hank dilators. . A 29mm  suction curette was advanced to the fundus, the vacuum was engaged, and multiple suction passes were performed until the products of conception were evacuated. A Sharp curettage was performed and a gritty texture was noted. A final suction pass was performed with minimal results. This completed the procedure. A bimanual massage was performed and confirmed good uterine tone.  All instruments were removed from the vagina.  Pressure was used to achieve hemostasis at the tenaculum site. The patient tolerated the procedure well was brought to the recovery room in stable condition for the procedure. All sponge and needle counts correct x2.   [place], [facility]

## 2021-03-22 NOTE — Anesthesia Preprocedure Evaluation (Signed)
Anesthesia Evaluation  Patient identified by MRN, date of birth, ID band Patient awake    Reviewed: Allergy & Precautions, NPO status , Patient's Chart, lab work & pertinent test results  Airway Mallampati: II  TM Distance: >3 FB     Dental   Pulmonary neg pulmonary ROS,    breath sounds clear to auscultation       Cardiovascular negative cardio ROS   Rhythm:Regular Rate:Normal     Neuro/Psych Anxiety Depression    GI/Hepatic negative GI ROS, Neg liver ROS,   Endo/Other  negative endocrine ROS  Renal/GU Renal disease     Musculoskeletal negative musculoskeletal ROS (+)   Abdominal   Peds  Hematology negative hematology ROS (+)   Anesthesia Other Findings   Reproductive/Obstetrics                             Anesthesia Physical Anesthesia Plan  ASA: 2  Anesthesia Plan: General   Post-op Pain Management:    Induction: Intravenous  PONV Risk Score and Plan: 3 and Ondansetron, Dexamethasone and Midazolam  Airway Management Planned: LMA  Additional Equipment:   Intra-op Plan:   Post-operative Plan: Extubation in OR  Informed Consent: I have reviewed the patients History and Physical, chart, labs and discussed the procedure including the risks, benefits and alternatives for the proposed anesthesia with the patient or authorized representative who has indicated his/her understanding and acceptance.     Dental advisory given  Plan Discussed with: CRNA and Anesthesiologist  Anesthesia Plan Comments:         Anesthesia Quick Evaluation

## 2021-03-22 NOTE — Discharge Instructions (Signed)
Pelvic rest x 2 weeks (no intercourse or tampons).  No tub baths or swimming for two weeks.    Call your doctor if you have heavy vaginal bleeding (soaking through a pad an hour or more for >2 hours in a row), temperature >101F, severe nausea, vomiting, severe or worsening abdominal pain, dizziness, shortness of breath, chest pain or any other concerns.  Please take motrin and/or tylenol every 6 hours as needed for pain.

## 2021-03-22 NOTE — Anesthesia Procedure Notes (Signed)
Procedure Name: Intubation Date/Time: 03/22/2021 2:44 PM Performed by: Dorthea Cove, CRNA Pre-anesthesia Checklist: Patient identified, Emergency Drugs available, Suction available and Patient being monitored Patient Re-evaluated:Patient Re-evaluated prior to induction Oxygen Delivery Method: Circle system utilized Preoxygenation: Pre-oxygenation with 100% oxygen Induction Type: IV induction, Rapid sequence and Cricoid Pressure applied Laryngoscope Size: Mac and 3 Grade View: Grade I Tube type: Oral Number of attempts: 1 Airway Equipment and Method: Stylet and Oral airway Placement Confirmation: ETT inserted through vocal cords under direct vision, positive ETCO2 and breath sounds checked- equal and bilateral Secured at: 21 cm Tube secured with: Tape Dental Injury: Teeth and Oropharynx as per pre-operative assessment

## 2021-03-22 NOTE — Anesthesia Postprocedure Evaluation (Signed)
Anesthesia Post Note  Patient: Jennifer Randall  Procedure(s) Performed: DILATATION AND EVACUATION     Anesthesia Post Evaluation No notable events documented.  Last Vitals:  Vitals:   03/22/21 1624 03/22/21 1639  BP: 96/60 93/62  Pulse: (!) 59 (!) 57  Resp: 20 14  Temp:  36.7 C  SpO2: 100% 100%    Last Pain:  Vitals:   03/22/21 1639  TempSrc:   PainSc: 0-No pain                 Macon Lesesne

## 2021-03-22 NOTE — H&P (Signed)
30 y.o. [id number] is referred by Dr. Philis Pique for evaluation of abnormal pregnancy of unknown location.  She should be [redacted]w[redacted]d by LMP of 01/17/2021.  She was seen for initial visit on 6/7.  At that time, ultrasound showed only a gestational sac.  Beta hcg was obtained and as follow: 6/7: 33825 6/9: 05397 6/13: 21111.    She had a follow up ultrasound yesterday on 6/14 which again showed an empty gestational sac without yolk sac or fetal pole.  Ovaries were normal bilaterally with corpus luteum on right ovary.  There was no free fluid.  She was seen by Dr. Philis Pique today due to mild LLQ.  She endorsed scant light brown spotting.  Denied moderate or heavy vaginal bleeding.  Exam was benign.  Dr. Philis Pique discussed with her that this is an abnormal pregnancy due to inappropriately rising bhcg.  Can not definitively say this is an intrauterine pregnancy due to lack of yolk sac.  Discussed diagnostic D&C vs methotrexate.  As her abdominal pain is mild with minimal vaginal bleeding, Dr. Philis Pique did not feel emergent laparoscopy was warranted and instead recommend diagnostic D&C.  If D&C returns with fetal tissue, this will confirm and treat an abnormal IUP.   If no fetal tissue, would plan treatment with methotrexate,  Past Medical History:  Diagnosis Date   Depression    Heart murmur     Past Surgical History:  Procedure Laterality Date   TONSILLECTOMY     TYMPANOSTOMY TUBE PLACEMENT      OB History  Gravida Para Term Preterm AB Living  3 2 2  0 0 2  SAB IAB Ectopic Multiple Live Births  0 0 0 0 2    # Outcome Date GA Lbr Len/2nd Weight Sex Delivery Anes PTL Lv  3 Gravida           2 Term 03/18/19 [redacted]w[redacted]d 14:16 / 00:23 3445 g F Vag-Spont None  LIV  1 Term 06/12/16 [redacted]w[redacted]d 61:33 3820 g M Vag-Spont EPI  LIV    Social History   Socioeconomic History   Marital status: Married    Spouse name: Not on file   Number of children: Not on file   Years of education: Not on file   Highest education level: Not on  file  Occupational History   Not on file  Tobacco Use   Smoking status: Never   Smokeless tobacco: Never  Vaping Use   Vaping Use: Never used  Substance and Sexual Activity   Alcohol use: Not Currently    Alcohol/week: 0.0 standard drinks   Drug use: No   Sexual activity: Yes    Partners: Male    Birth control/protection: None    Comment: No protection, married  Other Topics Concern   Not on file  Social History Narrative   Not on file   Social Determinants of Health   Financial Resource Strain: Not on file  Food Insecurity: Not on file  Transportation Needs: Not on file  Physical Activity: Not on file  Stress: Not on file  Social Connections: Not on file  Intimate Partner Violence: Not on file   Sulfa antibiotics     Vitals:   03/22/21 1156  BP: (!) 99/55  Pulse: 75  Resp: (!) 22  Temp: 98.1 F (36.7 C)  SpO2: 100%     General:  NAD Abdomen:  soft, non-distended, very mild tenderness to deep palpation in midline Ex:  no edema     A/P   30  y.o.  [id number] at 9 weeks by LMP presents for diagnostic D&C for abnormal pregnancy of unknown location.  Abdominal exam is benign with low concern for ruptured ectopic pregnancy.  If D&C confirms chorionic villi, IUP confirms.  If no chorionic villi, would proceed with methotrexate.  Will send pathology stat and check cmp today as well in event need to treat with methotrexate tomorrow. Preop hemoglobin 13.6.  Of note, patient with baseline BP of 90-100/50-60, so subjectively low BP is well within normal limits for patient.   Elects for surgical management.  Discussed risks to include infection, bleeding, damage to surrounding structures (including but not limited to vagina, cervix, bladder, uterus), uterine perforation, need for additional procedures.  All questions answered and patient elects to proceed.  Rh: O+  [place]

## 2021-03-22 NOTE — Transfer of Care (Signed)
Immediate Anesthesia Transfer of Care Note  Patient: Jennifer Randall  Procedure(s) Performed: DILATATION AND EVACUATION  Patient Location: PACU  Anesthesia Type:General  Level of Consciousness: awake, alert  and oriented  Airway & Oxygen Therapy: Patient Spontanous Breathing  Post-op Assessment: Report given to RN and Post -op Vital signs reviewed and stable  Post vital signs: Reviewed and stable  Last Vitals:  Vitals Value Taken Time  BP    Temp    Pulse 77   Resp    SpO2 100     Last Pain:  Vitals:   03/22/21 1222  TempSrc:   PainSc: 3       Patients Stated Pain Goal: 3 (33/00/76 2263)  Complications: No notable events documented.

## 2021-03-23 ENCOUNTER — Encounter (HOSPITAL_COMMUNITY): Payer: Self-pay | Admitting: Obstetrics

## 2021-03-23 LAB — SURGICAL PATHOLOGY

## 2021-04-20 ENCOUNTER — Encounter: Payer: Self-pay | Admitting: Internal Medicine

## 2021-08-01 IMAGING — US US OB < 14 WEEKS - US OB TV
1 series · 15 of 28 positions shown · non-contrast
Comparison: None.

CLINICAL DATA: Pregnant patient in early trimester pregnancy with
vaginal bleeding and pregnancy of unknown location.

EXAM:
OBSTETRIC <14 WK US AND TRANSVAGINAL OB US
TECHNIQUE: Both transabdominal and transvaginal ultrasound examinations were
performed for complete evaluation of the gestation as well as the
maternal uterus, adnexal regions, and pelvic cul-de-sac.
Transvaginal technique was performed to assess early pregnancy.

[Series 1: us ob < 14 weeks - us ob tv · 15 of 84 slices shown]
[im 1/84]
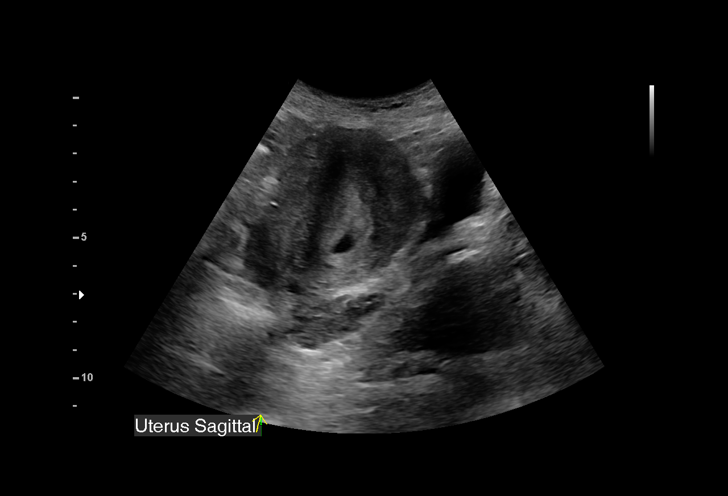
[im 7/84]
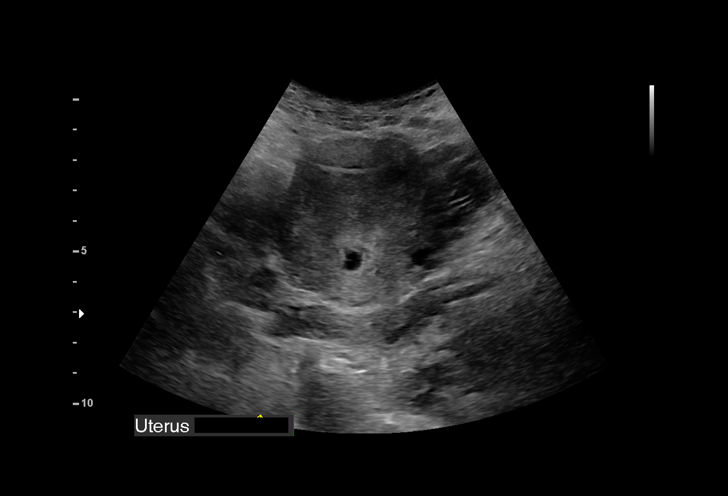
[im 13/84]
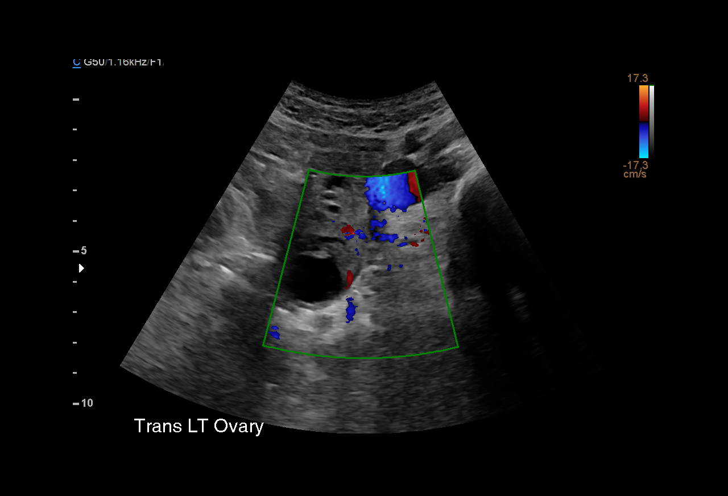
[im 19/84]
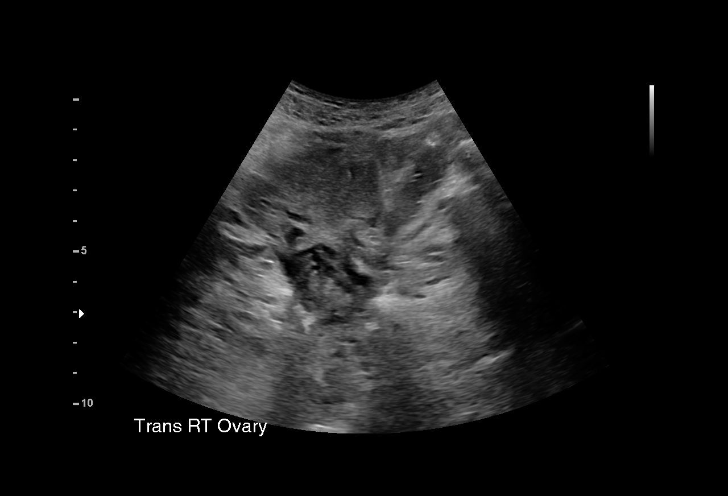
[im 25/84]
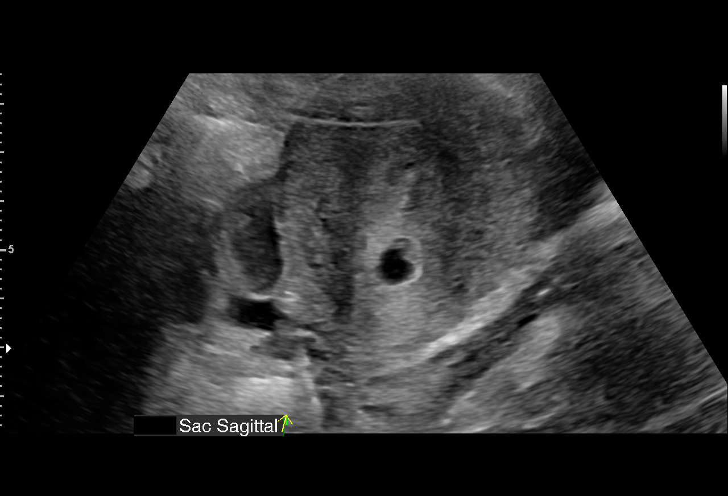
[im 31/84]
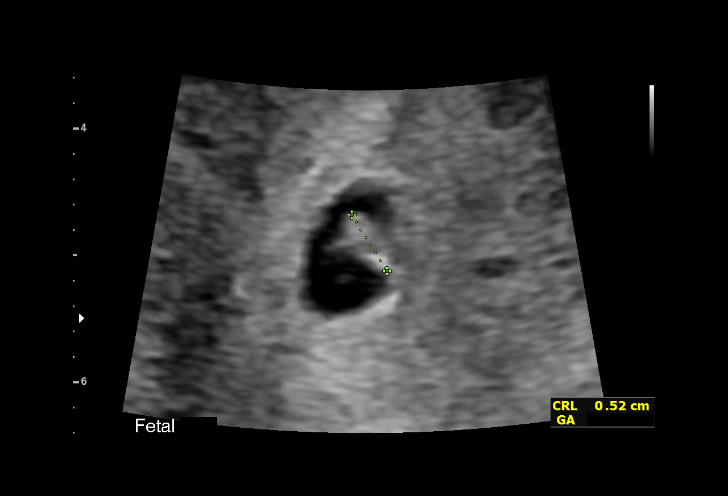
[im 37/84]
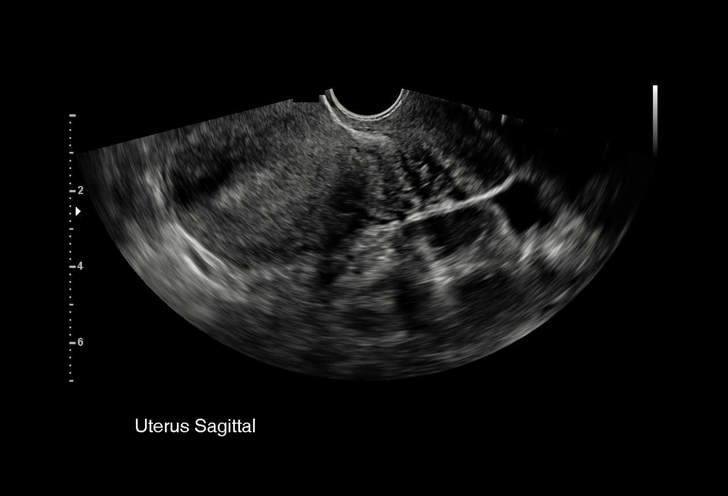
[im 44/84]
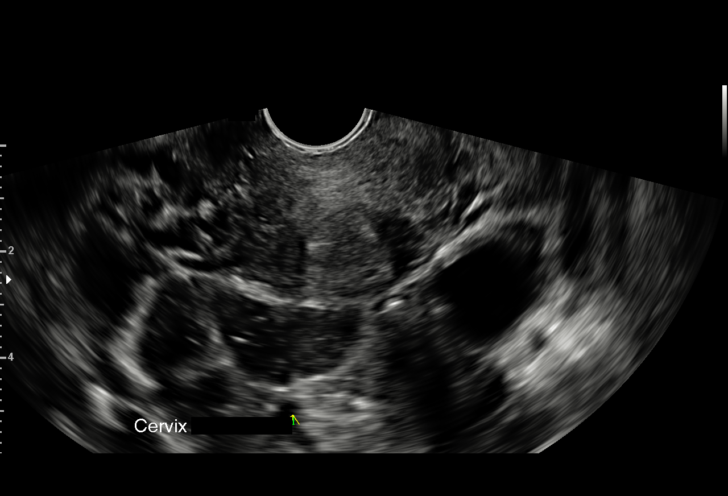
[im 47/84]
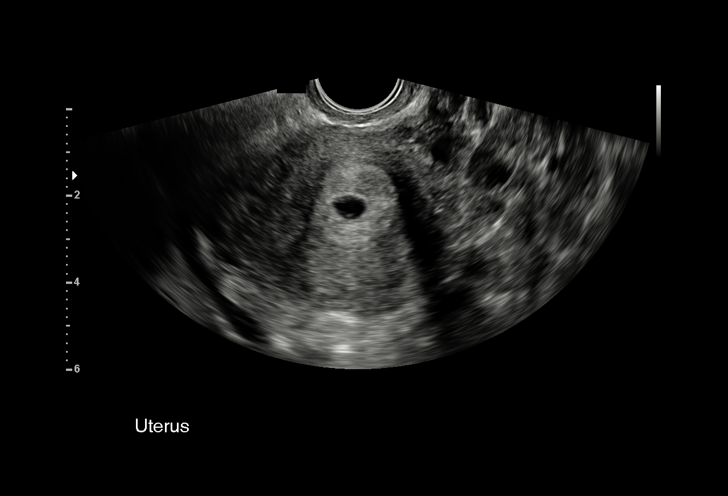
[im 53/84]
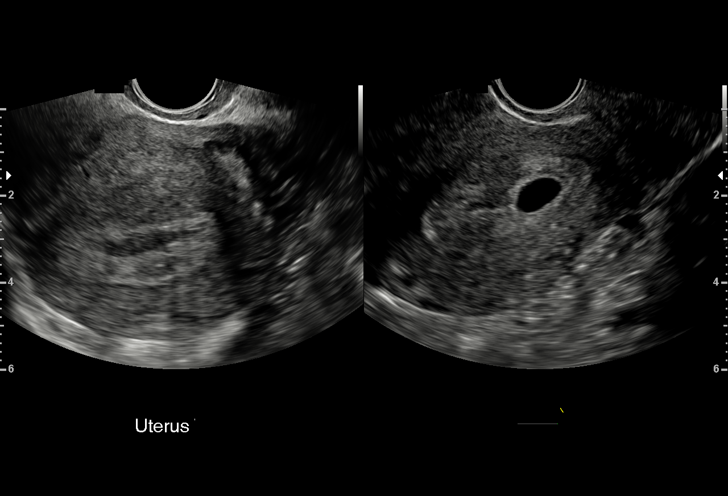
[im 59/84]
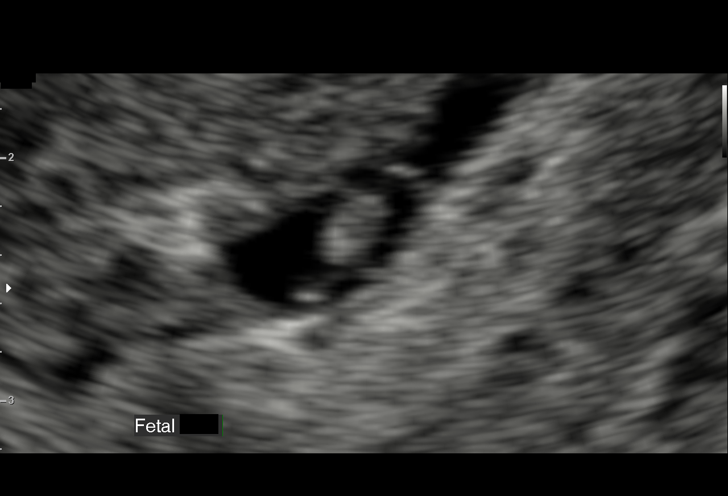
[im 65/84]
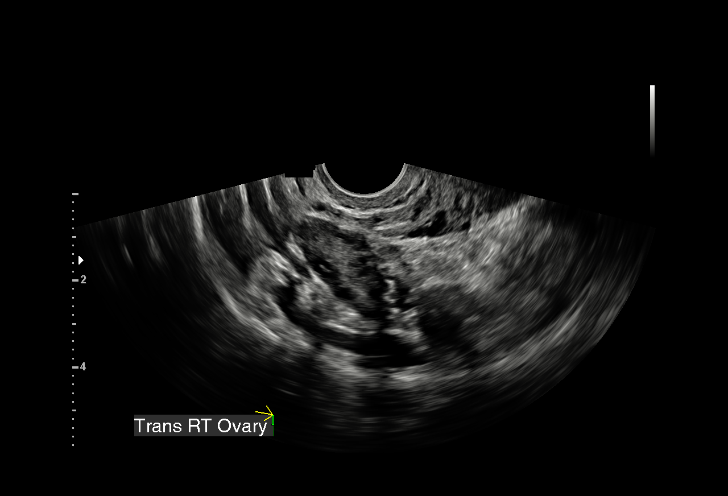
[im 71/84]
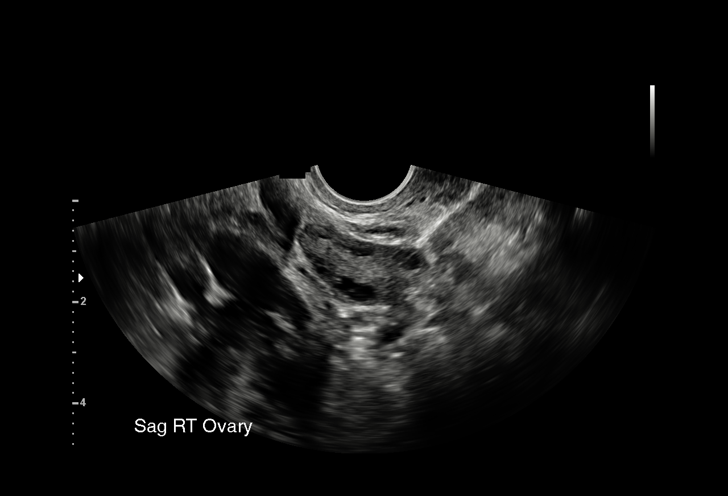
[im 77/84]
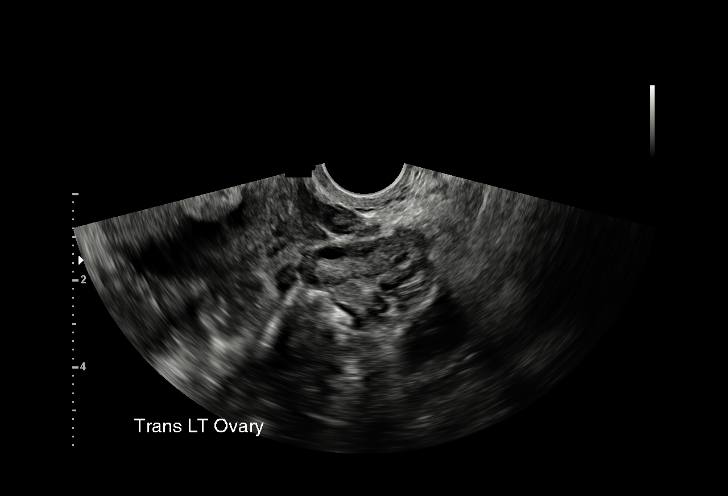
[im 84/84]
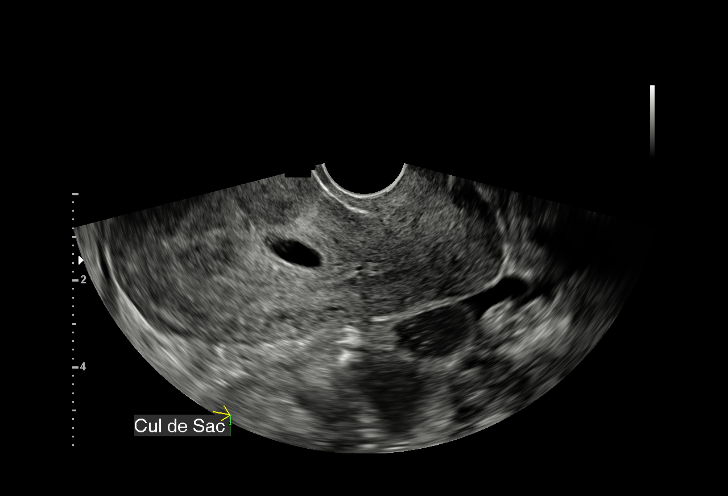

[15 of 28 positions shown; findings below may reference images not displayed]

FINDINGS: Intrauterine gestational sac: Single

Yolk sac:  Visualized.

Embryo:  Visualized.

Cardiac Activity: Visualized.

Heart Rate: 57 bpm

CRL:  3.7 mm   6 w   0 d                  US EDC: 12/29/2020

Subchorionic hemorrhage: Small measuring 1.9 x 1.5 x 0.4 cm superior
to the gestational sac.

Maternal uterus/adnexae: Single live intrauterine pregnancy. Small
subchorionic hemorrhage. Both ovaries are visualized. The right
ovary is normal. There is a physiologic corpus luteal cyst in the
left ovary. No extra ovarian adnexal mass. Trace pelvic free fluid.
IMPRESSION: 1. Single live intrauterine pregnancy estimated gestational age 6
weeks 0 days based on crown-rump length for ultrasound EDC
12/29/2020.
2. Fetal bradycardia which may be due to early gestational age.
Recommend close clinical follow-up.
3. Small subchorionic hemorrhage.

## 2021-09-13 ENCOUNTER — Encounter (HOSPITAL_COMMUNITY): Payer: Self-pay

## 2021-09-13 ENCOUNTER — Ambulatory Visit (HOSPITAL_COMMUNITY)
Admission: RE | Admit: 2021-09-13 | Discharge: 2021-09-13 | Disposition: A | Discharge status: Home / Self Care | Payer: BC Managed Care – PPO | Source: Ambulatory Visit | Attending: Family Medicine | Admitting: Family Medicine

## 2021-09-13 ENCOUNTER — Other Ambulatory Visit: Payer: Self-pay

## 2021-09-13 VITALS — BP 99/63 | HR 87 | Temp 98.5°F | Resp 20

## 2021-09-13 DIAGNOSIS — J029 Acute pharyngitis, unspecified: Secondary | ICD-10-CM | POA: Diagnosis not present

## 2021-09-13 MED ORDER — AMOXICILLIN 875 MG PO TABS: 875.0000 mg | ORAL_TABLET | Freq: Two times a day (BID) | ORAL | 0 refills | Status: DC

## 2021-09-13 NOTE — ED Provider Notes (Signed)
[facility]    CSN: [id number] Arrival date & time: 09/13/21  1758      History   Chief Complaint Chief Complaint  Patient presents with   Appointment    6:00 pm   Sore Throat    HPI Jennifer Randall is a 30 y.o. female.   HPI Left sided throat pain x 4 days. She has associated enlarged left cervical nodes. Patient has history of a tonsillectomy and has not had any fever and no history of recurrent strep as an adult.  She reports the pain has gradually increased over the last 4 days.  She has no other URI symptoms  Past Medical History:  Diagnosis Date   Depression    Heart murmur     Patient Active Problem List   Diagnosis Date Noted   Pelvic inflammatory disease 10/19/2020   Acute pyelonephritis 10/18/2020   Sepsis ([place]) 10/18/2020   Vaginal discharge 10/18/2020   Septic shock ([facility])    Fever    Flank pain    Pelvic pain     Past Surgical History:  Procedure Laterality Date   DILATION AND EVACUATION N/A 03/22/2021   Procedure: DILATATION AND EVACUATION;  Surgeon: Jerelyn Charles, MD;  Location: [facility];  Service: Gynecology;  Laterality: N/A;   TONSILLECTOMY     TYMPANOSTOMY TUBE PLACEMENT      OB History     Gravida  3   Para  2   Term  2   Preterm  0   AB  0   Living  2      SAB  0   IAB  0   Ectopic  0   Multiple  0   Live Births  2            Home Medications    Prior to Admission medications   Medication Sig Start Date End Date Taking? Authorizing Provider  amoxicillin (AMOXIL) 875 MG tablet Take 1 tablet (875 mg total) by mouth 2 (two) times daily. 09/13/21  Yes Scot Jun, FNP    Family History Family History  Problem Relation Age of Onset   Cancer Father    Hyperlipidemia Maternal Grandmother     Social History Social History   Tobacco Use   Smoking status: Never   Smokeless tobacco: Never  Vaping Use   Vaping Use: Never used  Substance Use Topics   Alcohol use: Not Currently     Alcohol/week: 0.0 standard drinks   Drug use: No     Allergies   Sulfa antibiotics   Review of Systems Review of Systems Pertinent negatives listed in HPI  Physical Exam Triage Vital Signs ED Triage Vitals  Enc Vitals Group     BP 09/13/21 1848 99/63     Pulse Rate 09/13/21 1848 87     Resp 09/13/21 1848 20     Temp 09/13/21 1848 98.5 F (36.9 C)     Temp Source 09/13/21 1848 Oral     SpO2 09/13/21 1848 99 %     Weight --      Height --      Head Circumference --      Peak Flow --      Pain Score 09/13/21 1844 7     Pain Loc --      Pain Edu? --      Excl. in [place]? --    No data found.  Updated Vital Signs BP 99/63 (BP Location: Left Arm)  Pulse 87   Temp 98.5 F (36.9 C) (Oral)   Resp 20   LMP 09/04/2021   SpO2 99%   Visual Acuity Right Eye Distance:   Left Eye Distance:   Bilateral Distance:    Right Eye Near:   Left Eye Near:    Bilateral Near:     Physical Exam Constitutional:      Appearance: She is well-developed.  HENT:     Head: Normocephalic and atraumatic.     Mouth/Throat:     Tongue: No lesions.     Pharynx: Pharyngeal swelling, posterior oropharyngeal erythema and uvula swelling present.   Cardiovascular:     Rate and Rhythm: Normal rate and regular rhythm.  Pulmonary:     Effort: Pulmonary effort is normal.     Breath sounds: Normal breath sounds.  Skin:    Capillary Refill: Capillary refill takes less than 2 seconds.  Neurological:     General: No focal deficit present.     Mental Status: She is alert.  Psychiatric:        Attention and Perception: Attention normal.        Mood and Affect: Mood normal.        Speech: Speech normal.        Cognition and Memory: Cognition normal.        Judgment: Judgment normal.     UC Treatments / Results  Labs (all labs ordered are listed, but only abnormal results are displayed) Labs Reviewed  POCT RAPID STREP A, ED / UC    EKG   Radiology No results  found.  Procedures Procedures (including critical care time)  Medications Ordered in UC Medications - No data to display  Initial Impression / Assessment and Plan / UC Course  I have reviewed the triage vital signs and the nursing notes.  Pertinent labs & imaging results that were available during my care of the patient were reviewed by me and considered in my medical decision making (see chart for details).    Throat Infection, localized swelling and diffuse redness. Amoxicillin 875 mg BID x 10 days. Gargle with warm salt water and Chloraseptic as needed for pain. RTC PRN  Final Clinical Impressions(s) / UC Diagnoses   Final diagnoses:  Throat infection     Discharge Instructions      Complete entire course of antibiotics. For acute pain manage as follows:  If you have a sore or scratchy throat, use a saltwater gargle-  to  teaspoon of salt dissolved in a 4-ounce to 8-ounce glass of warm water.  Gargle the solution for approximately 15-30 seconds and then spit.  It is important not to swallow the solution.  You can also use throat lozenges/cough drops and Chloraseptic spray to help with throat pain or discomfort.  Warm or cold liquids can also be helpful in relieving throat pain.      ED Prescriptions     Medication Sig Dispense Auth. Provider   amoxicillin (AMOXIL) 875 MG tablet Take 1 tablet (875 mg total) by mouth 2 (two) times daily. 20 tablet Scot Jun, FNP      PDMP not reviewed this encounter.   Scot Jun, FNP 09/13/21 2048

## 2021-09-13 NOTE — Discharge Instructions (Signed)
Complete entire course of antibiotics. For acute pain manage as follows:  If you have a sore or scratchy throat, use a saltwater gargle-  to  teaspoon of salt dissolved in a 4-ounce to 8-ounce glass of warm water.  Gargle the solution for approximately 15-30 seconds and then spit.  It is important not to swallow the solution.  You can also use throat lozenges/cough drops and Chloraseptic spray to help with throat pain or discomfort.  Warm or cold liquids can also be helpful in relieving throat pain.

## 2021-09-13 NOTE — ED Triage Notes (Signed)
Sore throat for 2 days.  Reports pain on left side of throat.

## 2021-10-17 ENCOUNTER — Encounter: Payer: Self-pay | Admitting: Family Medicine

## 2021-10-17 ENCOUNTER — Ambulatory Visit: Payer: BC Managed Care – PPO | Admitting: Family Medicine

## 2021-10-17 ENCOUNTER — Other Ambulatory Visit: Payer: Self-pay

## 2021-10-17 VITALS — BP 92/60 | HR 88 | Temp 98.2°F | Wt 113.8 lb

## 2021-10-17 DIAGNOSIS — M542 Cervicalgia: Secondary | ICD-10-CM

## 2021-10-17 DIAGNOSIS — R109 Unspecified abdominal pain: Secondary | ICD-10-CM

## 2021-10-17 DIAGNOSIS — Z87448 Personal history of other diseases of urinary system: Secondary | ICD-10-CM | POA: Diagnosis not present

## 2021-10-17 LAB — POCT URINALYSIS DIP (PROADVANTAGE DEVICE)
Bilirubin, UA: NEGATIVE
Glucose, UA: NEGATIVE mg/dL
Ketones, POC UA: NEGATIVE mg/dL
Leukocytes, UA: NEGATIVE
Nitrite, UA: NEGATIVE
Protein Ur, POC: NEGATIVE mg/dL
Specific Gravity, Urine: 1.015
Urobilinogen, Ur: 0.2
pH, UA: 7 (ref 5.0–8.0)

## 2021-10-17 NOTE — Patient Instructions (Signed)
Heat for 20 minutes 3 times per day with gentle stretching after that.  2 Tylenol 4 times per day.  Proper posturing

## 2021-10-17 NOTE — Addendum Note (Signed)
Addended by: Elyse Jarvis on: 10/17/2021 04:42 PM   Modules accepted: Orders

## 2021-10-17 NOTE — Progress Notes (Signed)
° °  Subjective:    Patient ID: Jennifer Randall, female    DOB: [birth year], 31 y.o.   MRN: [id number]  HPI She has a 2-day history of right flank discomfort but no fever, chills, dysuria urgency.  Does have a previous history of pyelonephritis which has her concerned.  She is now midcycle. She also complains of a 10-day history of neck pain left greater than right that is intermittent in nature.  He will occasionally wake her up.  When she tips her head backwards she does feel more discomfort.  She did on one occasion have radiation into her arm.   Review of Systems     Objective:   Physical Exam Alert and in no distress.  Full motion of the neck with minimal discomfort with full flexion or extension.  No palpable tenderness is noted.  Normal motor, sensory and DTRs of arms.  Some tenderness palpation in the upper trapezius area.  Urine microscopic was negative for red cells or bacteria.     Assessment & Plan:  Neck pain  History of pyelonephritis  Flank pain Heat for 20 minutes 3 times per day with gentle stretching after that.  2 Tylenol 4 times per day.  Proper posturing.  If continued difficulty come back for reevaluation and possible referral for physical therapy. Reassured her that I see no evidence of UTI and if further difficulty return for further evaluation.

## 2021-10-19 ENCOUNTER — Encounter: Payer: Self-pay | Admitting: Family Medicine

## 2022-01-13 IMAGING — CT CT ABD-PELV W/ CM
2 of 4 series · 16 of 46 positions shown, 18 images · IV contrast (APPLIED)
Comparison: None.

CLINICAL DATA: Abdominal pain and fever

EXAM:
CT ABDOMEN AND PELVIS WITH CONTRAST
TECHNIQUE: Multidetector CT imaging of the abdomen and pelvis was performed
using the standard protocol following bolus administration of
intravenous contrast.
CONTRAST:  100mL OMNIPAQUE IOHEXOL 300 MG/ML  SOLN

[Series 3: abd/ pelvis 5.0 i30f 2 · axial · 0.69mm/px · z∈[+573,+988]mm · 13 of 91 slices shown, 15 images]
[im 4/91  soft-tissue]
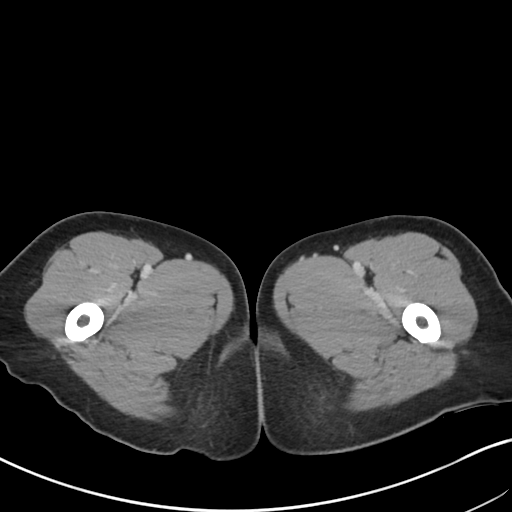
[im 4/91  bone]
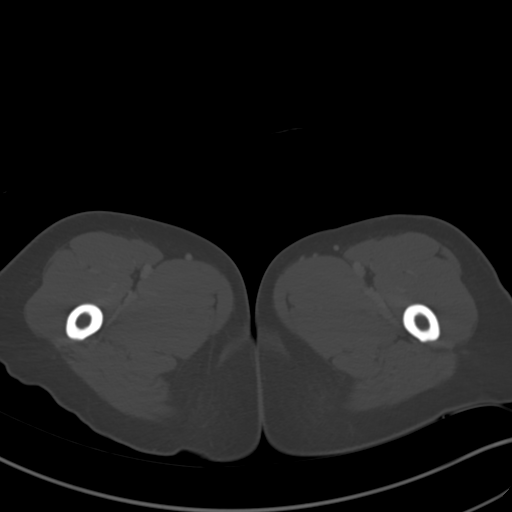
[im 11/91  soft-tissue]
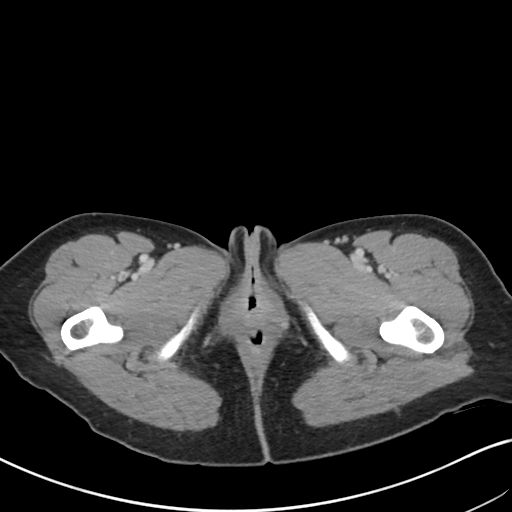
[im 19/91  soft-tissue]
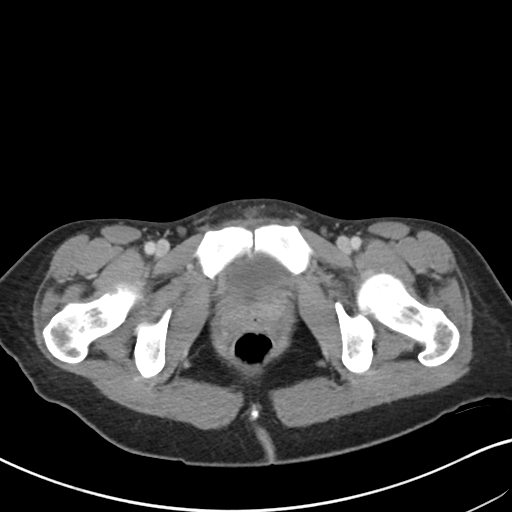
[im 26/91  soft-tissue]
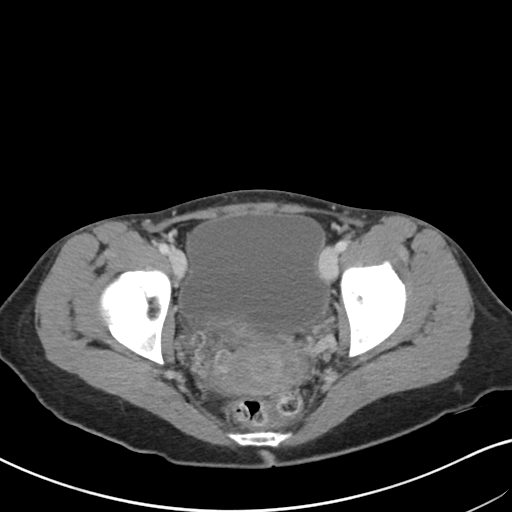
[im 33/91  soft-tissue]
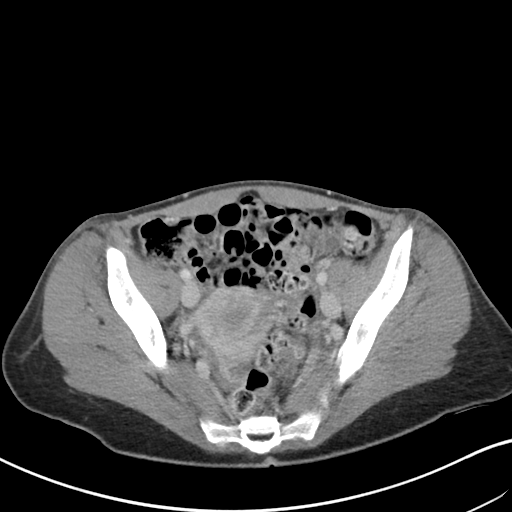
[im 40/91  soft-tissue]
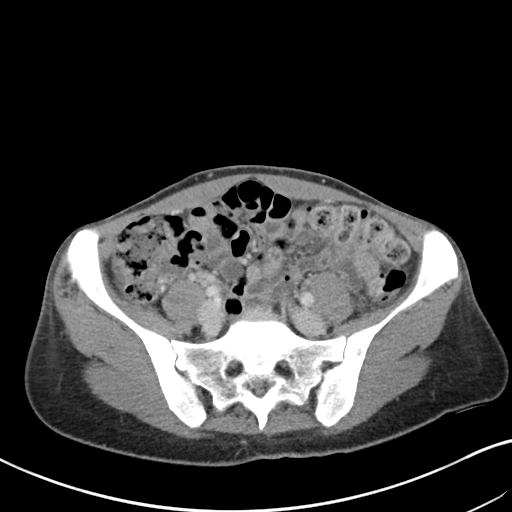
[im 47/91  soft-tissue]
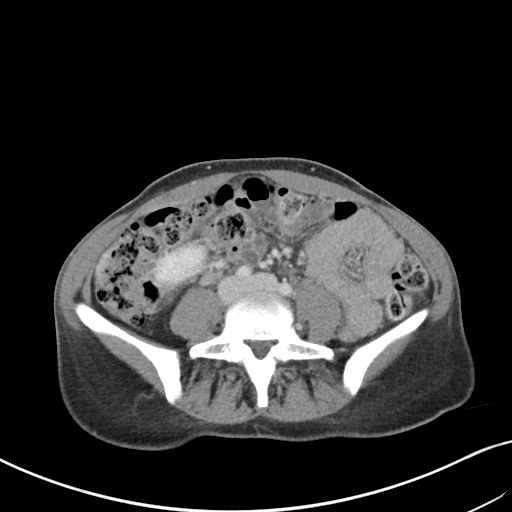
[im 51/91  soft-tissue]
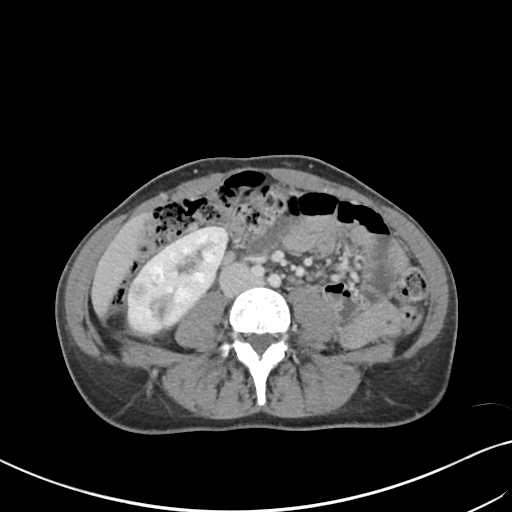
[im 58/91  soft-tissue]
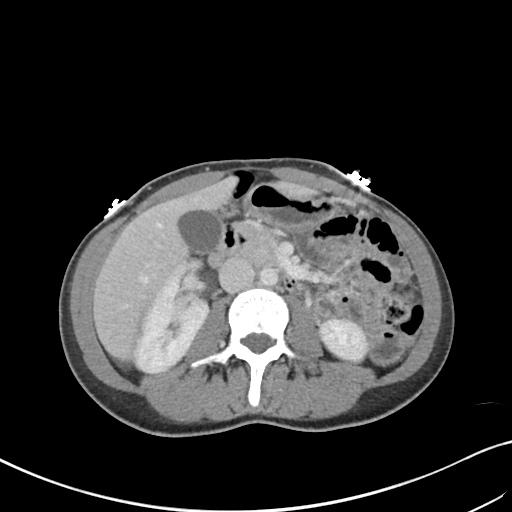
[im 58/91  bone]
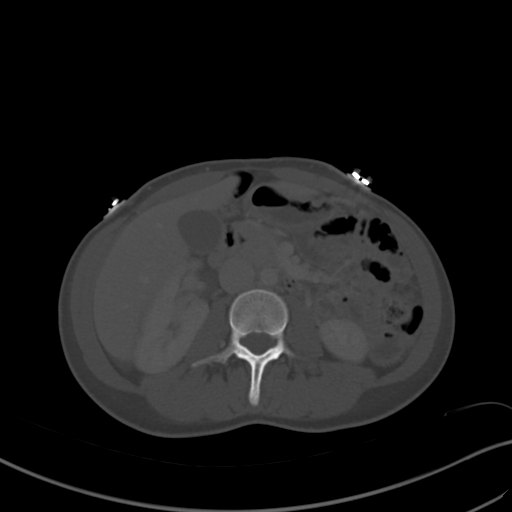
[im 65/91  soft-tissue]
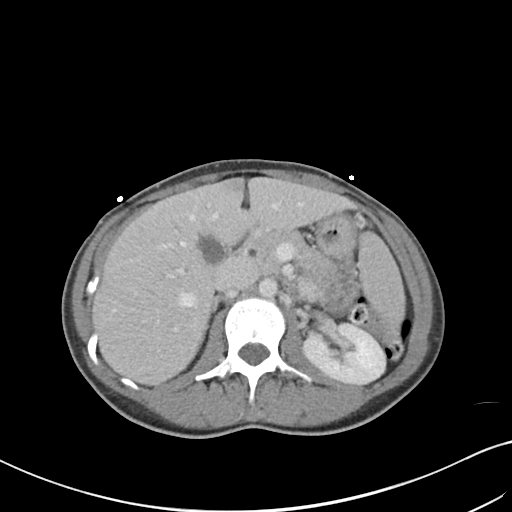
[im 73/91  soft-tissue]
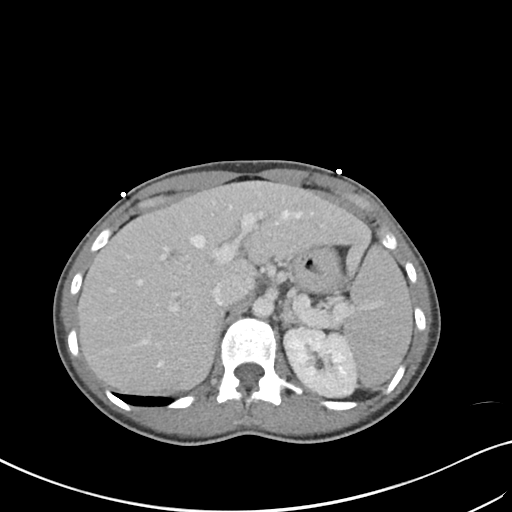
[im 80/91  soft-tissue]
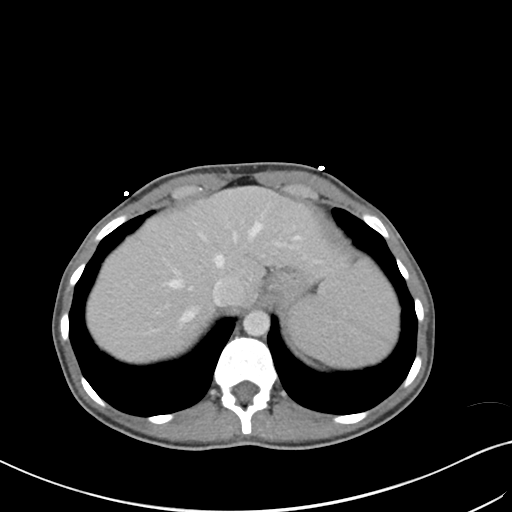
[im 87/91  soft-tissue]
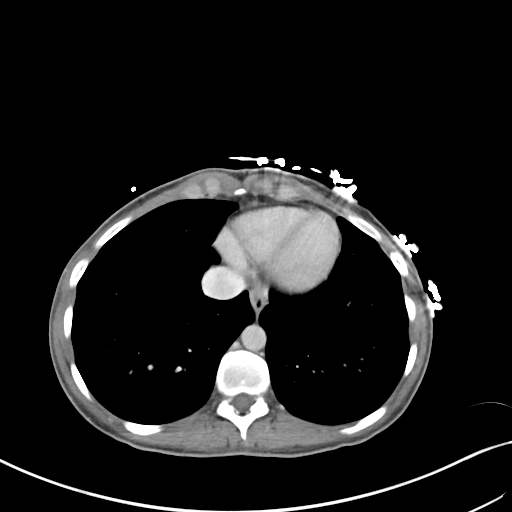

[Series 6: coronal soft tissue · coronal · 0.61mm/px · 3 of 73 slices shown]
[im 25/73  soft-tissue]
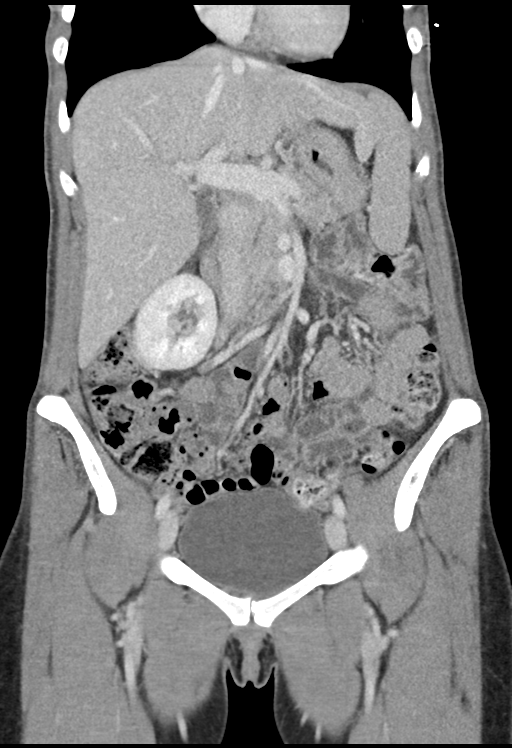
[im 33/73  soft-tissue]
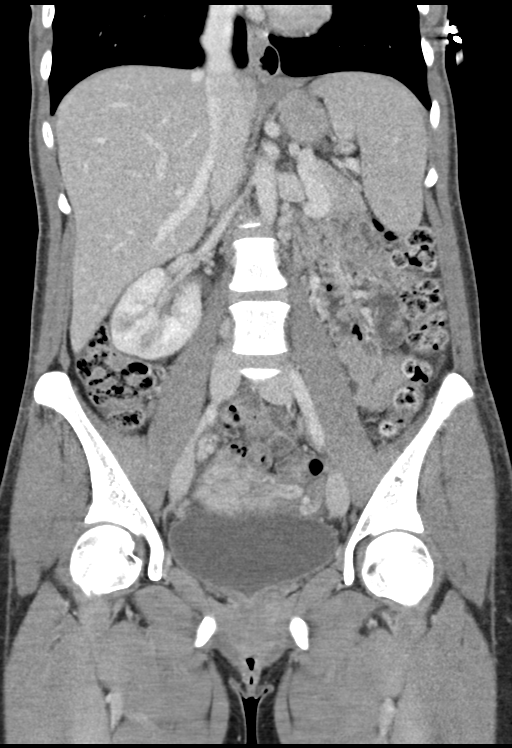
[im 41/73  soft-tissue]
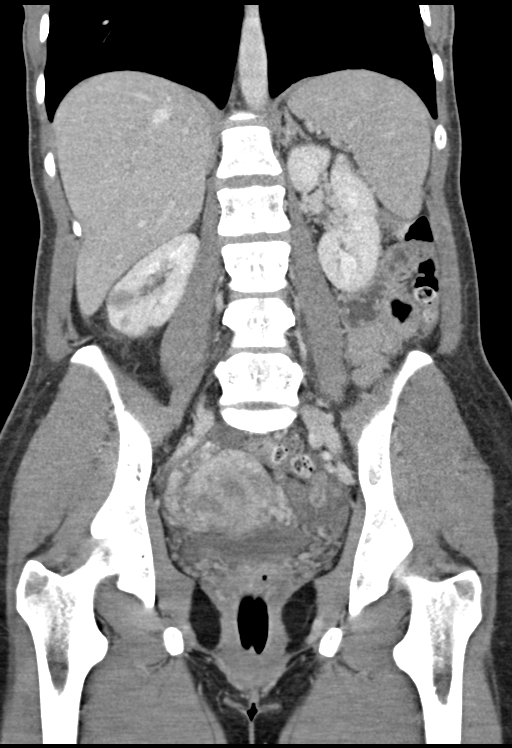

[16 of 46 positions shown; findings below may reference images not displayed]

FINDINGS: LOWER CHEST: Normal.

HEPATOBILIARY: Normal hepatic contours. No intra- or extrahepatic
biliary dilatation. The gallbladder is normal.

PANCREAS: Normal pancreas. No ductal dilatation or peripancreatic
fluid collection.

SPLEEN: Normal.

ADRENALS/URINARY TRACT: The adrenal glands are normal. Enlarged
right kidney with heterogeneous enhancement pattern. The urinary
bladder is normal for degree of distention

STOMACH/BOWEL: There is no hiatal hernia. Normal duodenal course and
caliber. No small bowel dilatation or inflammation. No focal colonic
abnormality. Normal appendix.

VASCULAR/LYMPHATIC: Normal course and caliber of the major abdominal
vessels. No abdominal or pelvic lymphadenopathy.

REPRODUCTIVE: Heterogeneous enhancement of the uterus. Left corpus
luteum. Small amount of free fluid in the posterior cul-de-sac.

MUSCULOSKELETAL. No bony spinal canal stenosis or focal osseous
abnormality.

OTHER: None.
IMPRESSION: 1. Enlarged, heterogeneous right kidney with heterogeneous
enhancement pattern. Correlate with urinalysis results as this
pattern may be seen in pyelonephritis.
2. Heterogeneous enhancement of the uterus, nonspecific. Consider
correlation with pelvic ultrasound.

## 2022-01-14 IMAGING — US US PELVIS COMPLETE TRANSABD/TRANSVAG W DUPLEX
1 series · 13 of 25 positions shown · non-contrast
Comparison: CT 10/17/2020, obstetrical ultrasound 05/05/2020

CLINICAL DATA: Pelvic pain, leukocytosis, abnormal CT

EXAM:
TRANSABDOMINAL AND TRANSVAGINAL ULTRASOUND OF PELVIS
DOPPLER ULTRASOUND OF OVARIES
TECHNIQUE: Both transabdominal and transvaginal ultrasound examinations of the
pelvis were performed. Transabdominal technique was performed for
global imaging of the pelvis including uterus, ovaries, adnexal
regions, and pelvic cul-de-sac.
It was necessary to proceed with endovaginal exam following the
transabdominal exam to visualize the uterus, endometrium and
ovaries. Color and duplex Doppler ultrasound was utilized to
evaluate blood flow to the ovaries.

[Series 1: us pelvic complete w transvaginal and torsion righ · 13 of 169 slices shown]
[im 1/169]
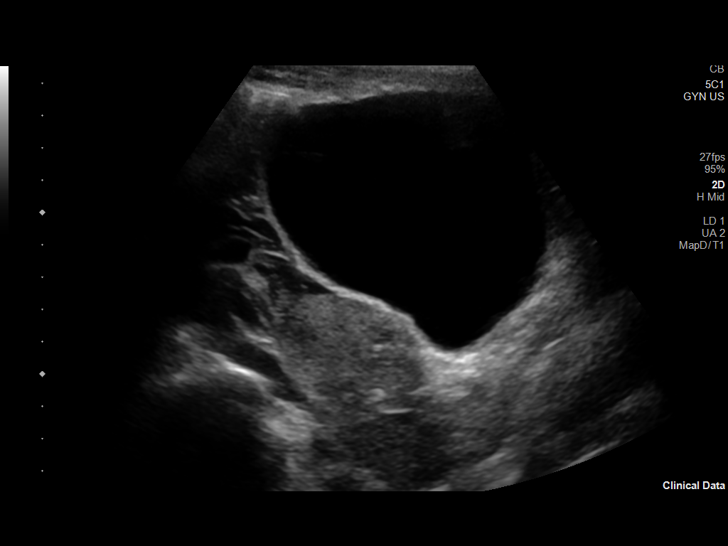
[im 15/169]
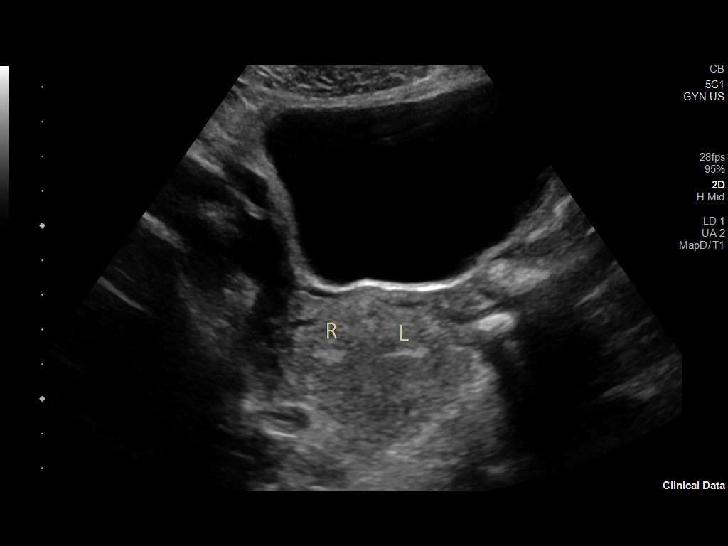
[im 29/169]
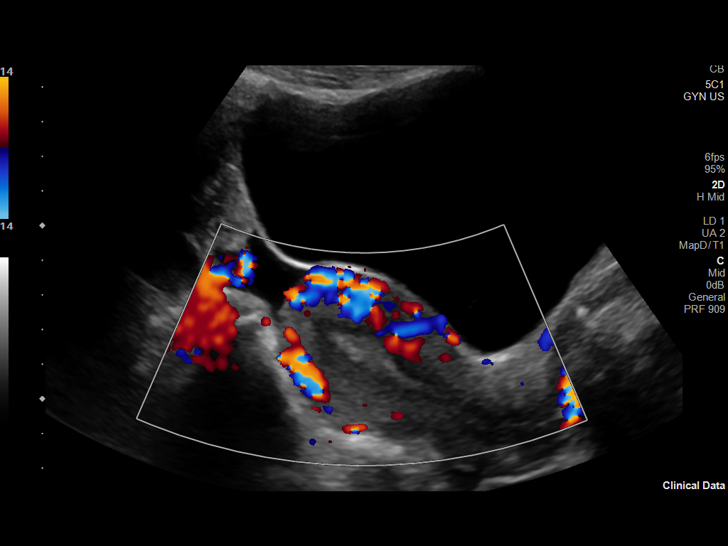
[im 43/169]
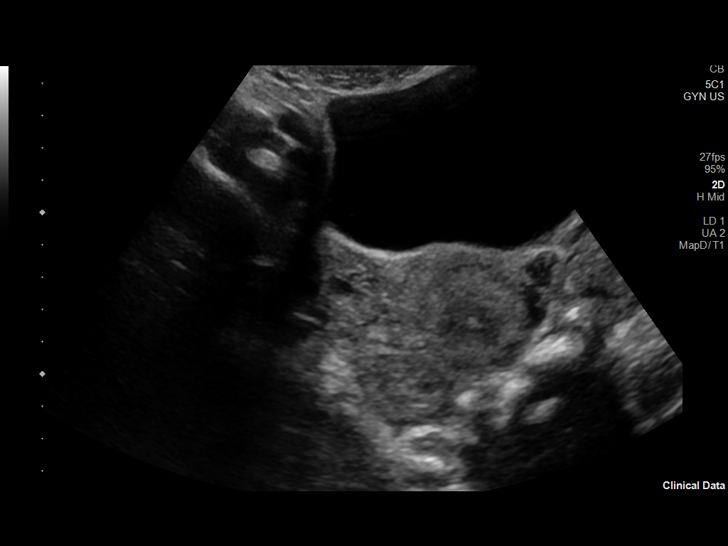
[im 57/169]
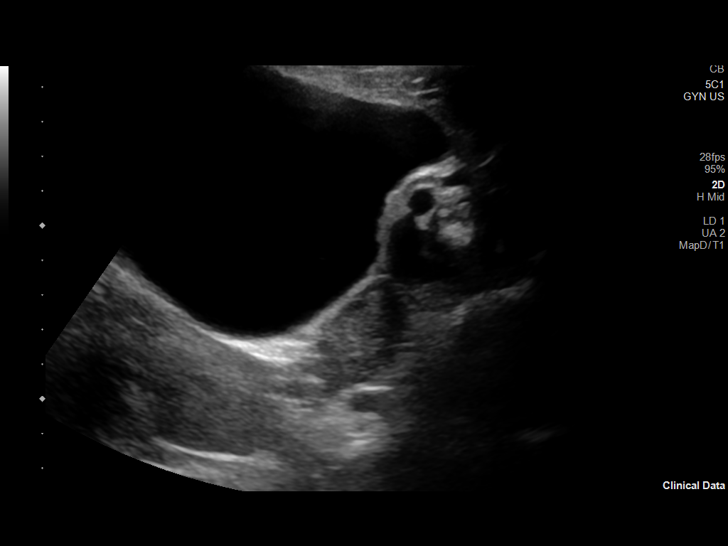
[im 71/169]
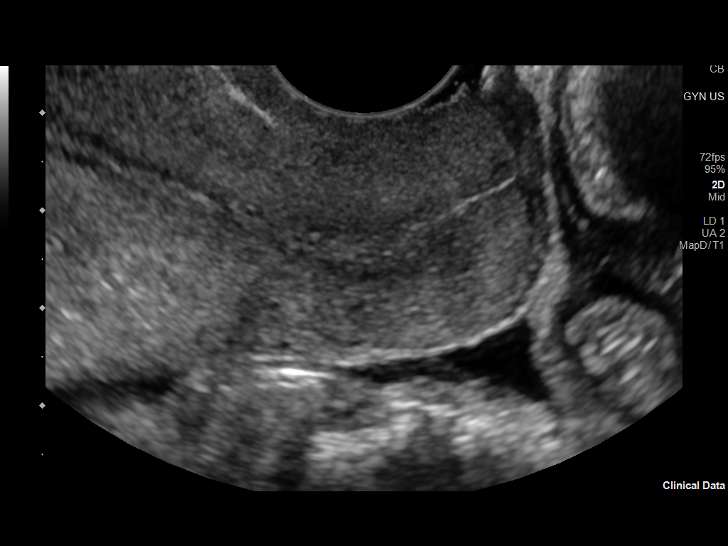
[im 85/169]
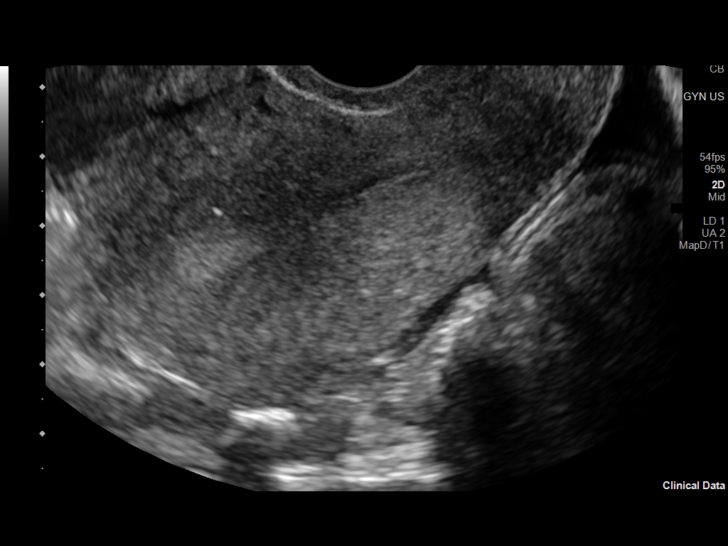
[im 99/169]
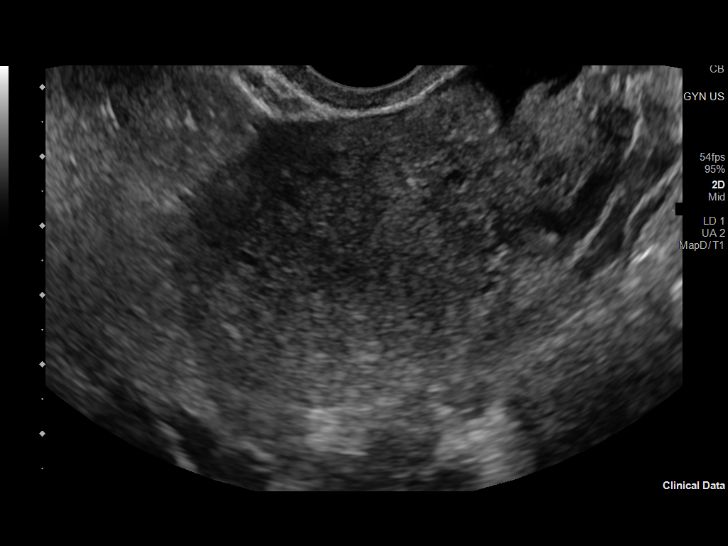
[im 113/169]
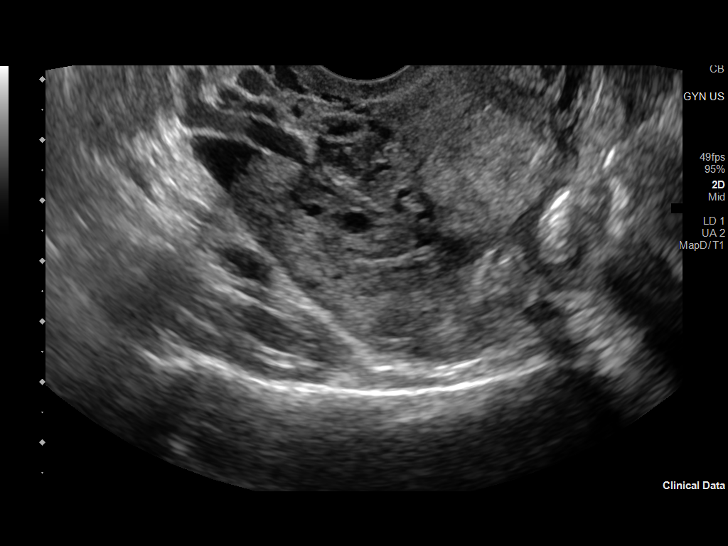
[im 127/169]
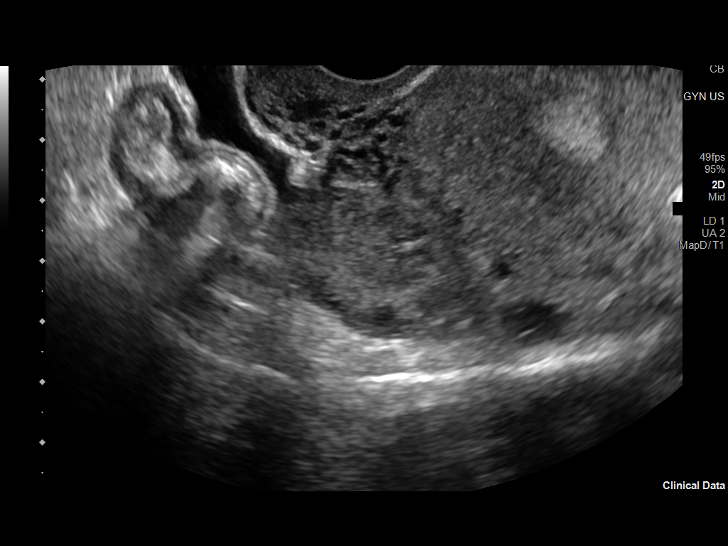
[im 141/169]
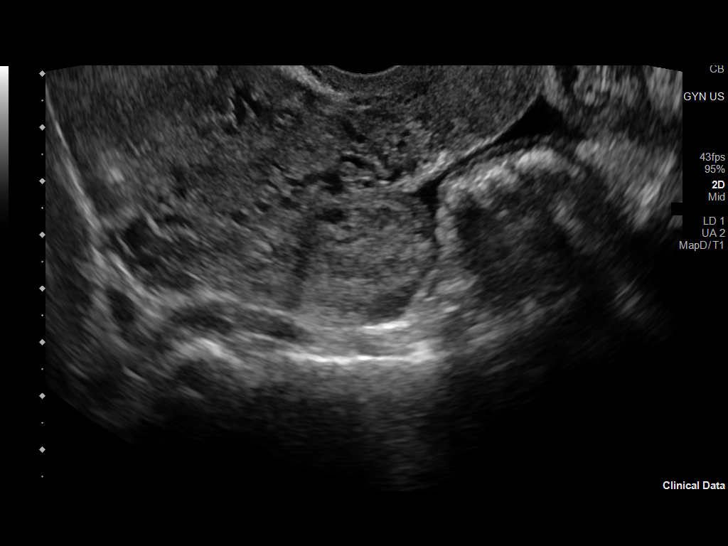
[im 155/169]
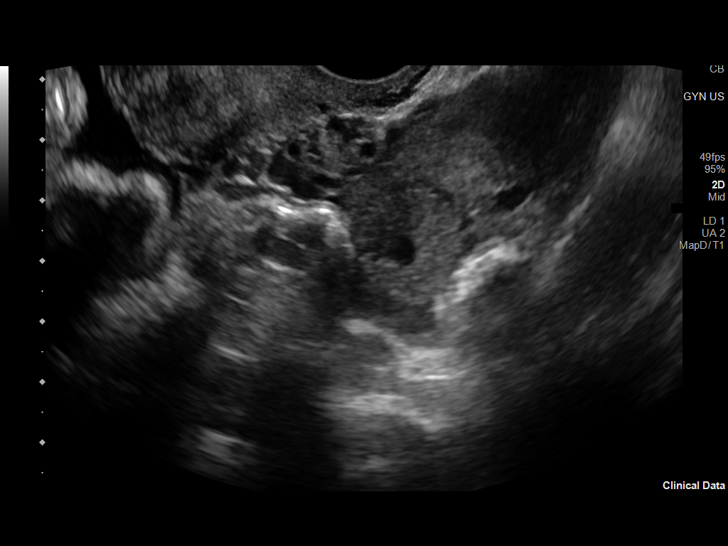
[im 169/169]
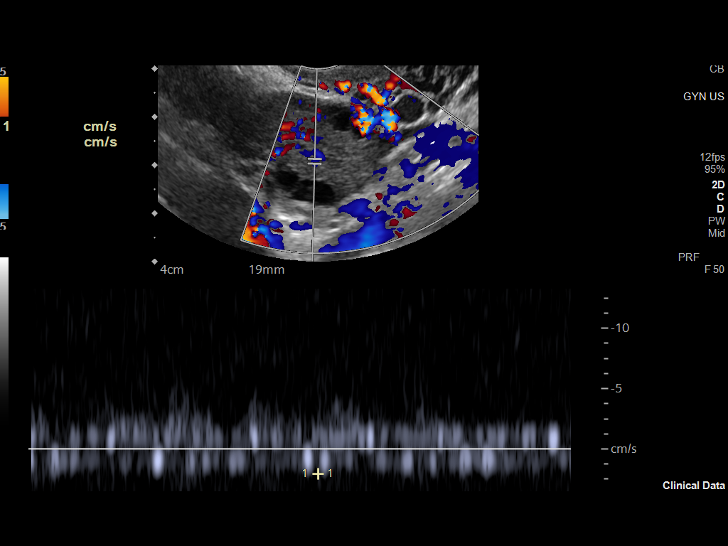

[13 of 25 positions shown; findings below may reference images not displayed]

FINDINGS: Uterus

Measurements: 8.4 x 4.4 x 5.9 cm = volume: 115 mL. Myometrium is
mildly thickened in fairly heterogenous with prominent vascular flow
on color Doppler imaging. Such an appearance is quite nonspecific.
Serpiginous appearance of some of the color flow may reflect
prominent vascular channels. No significant shadowing. No focal
concerning lesion is seen. Punctate echogenic subendometrial focus
seen along the

Endometrium

Thickness: 9.5 mm,. Arcuate appearance of the fundus. No focal
lesions.

Right ovary

Measurements: 3.8 x 2.7 x 2.7 cm = volume: 14.7 mL. Hypoechoic 2 cm
focus with peripheral color Doppler flow, suggestive in involuting
corpus luteum.

Left ovary

Measurements: 3.6 x 2.5 x 3.4 cm = volume: 15.7 mL. Thick-walled,
centrally cystic focus measuring 1.8 cm could reflect an additional
corpus luteum or functional follicle.

Pulsed Doppler evaluation of both ovaries demonstrates normal
low-resistance arterial and venous waveforms.

Other findings

Small volume free fluid in the pelvis, nonspecific in a reproductive
age female.

Prominent pelvic vasculature is noted along the margins of the
imaging with a similar appearance on CT imaging as well.
IMPRESSION: 1. Heterogeneous appearance of the myometrium with prominent
vascular flow on color Doppler imaging. Findings are nonspecific and
can be seen in the setting of pelvic congestion syndrome,
particularly given the presence of prominent pelvic vasculature on
both ultrasound and CT images. Alternative etiology could include
adenomyosis though the imaging fair is somewhat atypical in the
absence of shadowing.
2. Probable involuting corpus luteum in the right ovary.
Thick-walled, centrally cystic focus in the left ovary may reflect
an additional corpus luteum or functional follicle. No followup
imaging recommended. Note: This recommendation does not apply to
premenarchal patients or to those with increased risk (genetic,
family history, elevated tumor markers or other high-risk factors)
of ovarian cancer. Reference: Radiology [DATE]):[id number].
3. Small volume free fluid in the pelvis, nonspecific and often
physiologic in a reproductive age female.

## 2022-01-22 ENCOUNTER — Ambulatory Visit (HOSPITAL_COMMUNITY)
Admission: RE | Admit: 2022-01-22 | Discharge: 2022-01-22 | Disposition: A | Discharge status: Home / Self Care | Payer: BC Managed Care – PPO | Source: Ambulatory Visit

## 2022-01-22 ENCOUNTER — Encounter (HOSPITAL_COMMUNITY): Payer: Self-pay

## 2022-01-22 ENCOUNTER — Other Ambulatory Visit: Payer: Self-pay

## 2022-01-22 VITALS — BP 99/65 | HR 82 | Temp 98.6°F | Resp 18

## 2022-01-22 DIAGNOSIS — J301 Allergic rhinitis due to pollen: Secondary | ICD-10-CM | POA: Diagnosis not present

## 2022-01-22 MED ORDER — AMOXICILLIN 875 MG PO TABS: 875.0000 mg | ORAL_TABLET | Freq: Two times a day (BID) | ORAL | 0 refills | Status: AC

## 2022-01-22 NOTE — Discharge Instructions (Signed)
-  Flonase nasal steroid: place 2 sprays into both nostrils in the morning and at bedtime for at least 7 days. Continue for longer if this is helping. ?-Pick up Allegra or Zyrtec and use this daily for allergies. Use this at least once daily x7 days, continue for longer if it's helping.  ?-If symptoms get worse instead of better- facial pain, new fevers, thick nasal congestion- start the amoxicilin twice daily x7 days.  ?

## 2022-01-22 NOTE — ED Triage Notes (Signed)
Patient has cough, raspy throat.  Initially had a scratchy throat that started 2 weeks ago.  Then started having runny nose, facial pressure in forehead and cheeks.  Pressure is not as bad as it was last week.  Has pressure in ears and left ear is painful.  No fever.  Patient has never had allergies in the past ?

## 2022-01-22 NOTE — ED Provider Notes (Signed)
?[facility] ? ? ? ?CSN: [id number] ?Arrival date & time: 01/22/22  1804 ? ? ?  ? ?History   ?Chief Complaint ?Chief Complaint  ?Patient presents with  ? Appointment  ?  6:00pm  ? Cough  ? ? ?HPI ?Jennifer Randall is a 31 y.o. female presenting with congestion, sore throat, raspy voice, nonproductive cough for 2 weeks.  History noncontributory.  Describes symptoms for 2 weeks, unchanged.  States cough is worse at night, nonproductive.  Initially with some mild pressure behind the forehead, this is actually improved.  Postnasal drip.  Initially used Mucinex with some relief, but is no longer using this. ? ?HPI ? ?Past Medical History:  ?Diagnosis Date  ? Depression   ? Heart murmur   ? ? ?Patient Active Problem List  ? Diagnosis Date Noted  ? Pelvic inflammatory disease 10/19/2020  ? Acute pyelonephritis 10/18/2020  ? Sepsis ([place]) 10/18/2020  ? Vaginal discharge 10/18/2020  ? Septic shock ([place])   ? Fever   ? Flank pain   ? Pelvic pain   ? ? ?Past Surgical History:  ?Procedure Laterality Date  ? DILATION AND EVACUATION N/A 03/22/2021  ? Procedure: DILATATION AND EVACUATION;  Surgeon: Jerelyn Charles, MD;  Location: [facility];  Service: Gynecology;  Laterality: N/A;  ? TONSILLECTOMY    ? TYMPANOSTOMY TUBE PLACEMENT    ? ? ?OB History   ? ? Gravida  ?3  ? Para  ?2  ? Term  ?2  ? Preterm  ?0  ? AB  ?0  ? Living  ?2  ?  ? ? SAB  ?0  ? IAB  ?0  ? Ectopic  ?0  ? Multiple  ?0  ? Live Births  ?2  ?   ?  ?  ? ? ? ?Home Medications   ? ?Prior to Admission medications   ?Medication Sig Start Date End Date Taking? Authorizing Provider  ?acetaminophen (TYLENOL) 325 MG tablet Take 650 mg by mouth every 6 (six) hours as needed.   Yes [provider]  ?amoxicillin (AMOXIL) 875 MG tablet Take 1 tablet (875 mg total) by mouth 2 (two) times daily for 7 days. 01/22/22 01/29/22 Yes Hazel Sams, PA-C  ? ? ?Family History ?Family History  ?Problem Relation Age of Onset  ? Cancer Father   ? Hyperlipidemia Maternal Grandmother    ? ? ?Social History ?Social History  ? ?Tobacco Use  ? Smoking status: Never  ? Smokeless tobacco: Never  ?Vaping Use  ? Vaping Use: Never used  ?Substance Use Topics  ? Alcohol use: Not Currently  ?  Alcohol/week: 0.0 standard drinks  ? Drug use: No  ? ? ? ?Allergies   ?Sulfa antibiotics ? ? ?Review of Systems ?Review of Systems  ?Constitutional:  Negative for appetite change, chills and fever.  ?HENT:  Positive for congestion. Negative for ear pain, rhinorrhea, sinus pressure, sinus pain and sore throat.   ?Eyes:  Negative for redness and visual disturbance.  ?Respiratory:  Positive for cough. Negative for chest tightness, shortness of breath and wheezing.   ?Cardiovascular:  Negative for chest pain and palpitations.  ?Gastrointestinal:  Negative for abdominal pain, constipation, diarrhea, nausea and vomiting.  ?Genitourinary:  Negative for dysuria, frequency and urgency.  ?Musculoskeletal:  Negative for myalgias.  ?Neurological:  Negative for dizziness, weakness and headaches.  ?Psychiatric/Behavioral:  Negative for confusion.   ?All other systems reviewed and are negative. ? ? ?Physical Exam ?Triage Vital Signs ?ED Triage Vitals  ?  Enc Vitals Group  ?   BP 01/22/22 1842 99/65  ?   Pulse Rate 01/22/22 1842 82  ?   Resp 01/22/22 1842 18  ?   Temp 01/22/22 1842 98.6 ?F (37 ?C)  ?   Temp Source 01/22/22 1842 Oral  ?   SpO2 01/22/22 1842 97 %  ?   Weight --   ?   Height --   ?   Head Circumference --   ?   Peak Flow --   ?   Pain Score 01/22/22 1838 4  ?   Pain Loc --   ?   Pain Edu? --   ?   Excl. in [place]? --   ? ?No data found. ? ?Updated Vital Signs ?BP 99/65 (BP Location: Right Arm)   Pulse 82   Temp 98.6 ?F (37 ?C) (Oral)   Resp 18   LMP 01/17/2022 (Approximate)   SpO2 97%  ? ?Visual Acuity ?Right Eye Distance:   ?Left Eye Distance:   ?Bilateral Distance:   ? ?Right Eye Near:   ?Left Eye Near:    ?Bilateral Near:    ? ?Physical Exam ?Vitals reviewed.  ?Constitutional:   ?   General: She is not in acute  distress. ?   Appearance: Normal appearance. She is not ill-appearing.  ?HENT:  ?   Head: Normocephalic and atraumatic.  ?   Right Ear: Tympanic membrane, ear canal and external ear normal. No tenderness. No middle ear effusion. There is no impacted cerumen. Tympanic membrane is not perforated, erythematous, retracted or bulging.  ?   Left Ear: Tympanic membrane, ear canal and external ear normal. No tenderness.  No middle ear effusion. There is no impacted cerumen. Tympanic membrane is not perforated, erythematous, retracted or bulging.  ?   Nose: Congestion present.  ?   Right Sinus: No maxillary sinus tenderness or frontal sinus tenderness.  ?   Left Sinus: No maxillary sinus tenderness or frontal sinus tenderness.  ?   Mouth/Throat:  ?   Mouth: Mucous membranes are moist.  ?   Pharynx: Uvula midline. No oropharyngeal exudate or posterior oropharyngeal erythema.  ?   Comments: Mild erythema and cobblestoning posterior pharynx.  Tonsils are small. ?Eyes:  ?   Extraocular Movements: Extraocular movements intact.  ?   Pupils: Pupils are equal, round, and reactive to light.  ?Cardiovascular:  ?   Rate and Rhythm: Normal rate and regular rhythm.  ?   Heart sounds: Normal heart sounds.  ?Pulmonary:  ?   Effort: Pulmonary effort is normal.  ?   Breath sounds: Normal breath sounds. No decreased breath sounds, wheezing, rhonchi or rales.  ?Abdominal:  ?   Palpations: Abdomen is soft.  ?   Tenderness: There is no abdominal tenderness. There is no guarding or rebound.  ?Lymphadenopathy:  ?   Cervical: No cervical adenopathy.  ?   Right cervical: No superficial cervical adenopathy. ?   Left cervical: No superficial cervical adenopathy.  ?Neurological:  ?   General: No focal deficit present.  ?   Mental Status: She is alert and oriented to person, place, and time.  ?Psychiatric:     ?   Mood and Affect: Mood normal.     ?   Behavior: Behavior normal.     ?   Thought Content: Thought content normal.     ?   Judgment: Judgment  normal.  ? ? ? ?UC Treatments / Results  ?Labs ?(all labs ordered are  listed, but only abnormal results are displayed) ?Labs Reviewed - No data to display ? ?EKG ? ? ?Radiology ?No results found. ? ?Procedures ?Procedures (including critical care time) ? ?Medications Ordered in UC ?Medications - No data to display ? ?Initial Impression / Assessment and Plan / UC Course  ?I have reviewed the triage vital signs and the nursing notes. ? ?Pertinent labs & imaging results that were available during my care of the patient were reviewed by me and considered in my medical decision making (see chart for details). ? ?  ? ?This patient is a very pleasant 31 y.o. year old female presenting with allergic rhinitis. Afebrile, nontachy. There is no sinus tenderness and AOM. ? ?Trial of zyrtec and flonase. If symptoms worsen, including facial pain or ear pain, start the amoxicillin.  ? ?ED return precautions discussed. Patient verbalizes understanding and agreement.  ?  ? ?Final Clinical Impressions(s) / UC Diagnoses  ? ?Final diagnoses:  ?Seasonal allergic rhinitis due to pollen  ? ? ? ?Discharge Instructions   ? ?  ?-Flonase nasal steroid: place 2 sprays into both nostrils in the morning and at bedtime for at least 7 days. Continue for longer if this is helping. ?-Pick up Allegra or Zyrtec and use this daily for allergies. Use this at least once daily x7 days, continue for longer if it's helping.  ?-If symptoms get worse instead of better- facial pain, new fevers, thick nasal congestion- start the amoxicilin twice daily x7 days.  ? ? ?ED Prescriptions   ? ? Medication Sig Dispense Auth. Provider  ? amoxicillin (AMOXIL) 875 MG tablet Take 1 tablet (875 mg total) by mouth 2 (two) times daily for 7 days. 14 tablet Hazel Sams, PA-C  ? ?  ? ?PDMP not reviewed this encounter. ?  ?Hazel Sams, PA-C ?01/22/22 1900 ? ?

## 2022-04-30 ENCOUNTER — Encounter: Payer: Self-pay | Admitting: Family Medicine

## 2022-04-30 ENCOUNTER — Ambulatory Visit (INDEPENDENT_AMBULATORY_CARE_PROVIDER_SITE_OTHER): Payer: BC Managed Care – PPO | Admitting: Family Medicine

## 2022-04-30 VITALS — BP 94/62 | HR 83 | Temp 97.3°F | Wt 114.0 lb

## 2022-04-30 DIAGNOSIS — B070 Plantar wart: Secondary | ICD-10-CM | POA: Diagnosis not present

## 2022-04-30 DIAGNOSIS — G479 Sleep disorder, unspecified: Secondary | ICD-10-CM | POA: Diagnosis not present

## 2022-04-30 DIAGNOSIS — D229 Melanocytic nevi, unspecified: Secondary | ICD-10-CM

## 2022-04-30 DIAGNOSIS — R1011 Right upper quadrant pain: Secondary | ICD-10-CM

## 2022-04-30 NOTE — Progress Notes (Signed)
   Subjective:    Patient ID: Jennifer Randall, female    DOB: 04-27-1991, 31 y.o.   MRN: [id number]  HPI She is here for multiple issues.  She has a several day history of difficulty with right upper quadrant pain and on occasion did have it radiate into her back.  No associated nausea, vomiting, bloating, diarrhea, urinary symptoms.  She is just past midcycle.  There is not a cramping nature to this. She also has noted increased difficulty with sleep and states that there is a family history of sleep disturbance.  She also has a mole on her back that she would like evaluated and also plantar wart that she would like seen input into care for this.   Review of Systems     Objective:   Physical Exam Alert and in no distress.  Cardiac exam shows regular rhythm without murmurs or gallops.  Lungs are clear to auscultation.  Abdominal exam shows slight upper quadrant and mid quadrant tenderness.  Murphy's punch negative.  No rebound.  Exam of her back does show a 1-1/2 cm raised variably pigmented lesion with well-demarcated borders.  She has other moles that are well demarcated and consistent in color.  Exam of her right foot over the heel does show a mosaic type wart present.       Assessment & Plan:  Right upper quadrant pain  Plantar wart of right foot  Numerous moles  Sleep disturbance Recommend 40 mg of Prilosec daily and also pay attention to any particular things that make the pain better or worse in her abdominal ard I reassured her that none of the moles were of any major.  Recommend using Compound W regularly for 3 to 4 days, using a pumice to remove as much as the white dead tissue as possible and reapplying this on the weekly basis. Information given concerning sleep and sleep hygiene

## 2022-04-30 NOTE — Patient Instructions (Signed)
Take 2 Prilosec daily and pay attention to foods in particular to see if that plays a role in itInsomnia Insomnia is a sleep disorder that makes it difficult to fall asleep or stay asleep. Insomnia can cause fatigue, low energy, difficulty concentrating, mood swings, and poor performance at work or school. There are three different ways to classify insomnia: Difficulty falling asleep. Difficulty staying asleep. Waking up too early in the morning. Any type of insomnia can be long-term (chronic) or short-term (acute). Both are common. Short-term insomnia usually lasts for 3 months or less. Chronic insomnia occurs at least three times a week for longer than 3 months. What are the causes? Insomnia may be caused by another condition, situation, or substance, such as: Having certain mental health conditions, such as anxiety and depression. Using caffeine, alcohol, tobacco, or drugs. Having gastrointestinal conditions, such as gastroesophageal reflux disease (GERD). Having certain medical conditions. These include: Asthma. Alzheimer's disease. Stroke. Chronic pain. An overactive thyroid gland (hyperthyroidism). Other sleep disorders, such as restless legs syndrome and sleep apnea. Menopause. Sometimes, the cause of insomnia may not be known. What increases the risk? Risk factors for insomnia include: Gender. Females are affected more often than males. Age. Insomnia is more common as people get older. Stress and certain medical and mental health conditions. Lack of exercise. Having an irregular work schedule. This may include working night shifts and traveling between different time zones. What are the signs or symptoms? If you have insomnia, the main symptom is having trouble falling asleep or having trouble staying asleep. This may lead to other symptoms, such as: Feeling tired or having low energy. Feeling nervous about going to sleep. Not feeling rested in the morning. Having trouble  concentrating. Feeling irritable, anxious, or depressed. How is this diagnosed? This condition may be diagnosed based on: Your symptoms and medical history. Your health care provider may ask about: Your sleep habits. Any medical conditions you have. Your mental health. A physical exam. How is this treated? Treatment for insomnia depends on the cause. Treatment may focus on treating an underlying condition that is causing the insomnia. Treatment may also include: Medicines to help you sleep. Counseling or therapy. Lifestyle adjustments to help you sleep better. Follow these instructions at home: Eating and drinking  Limit or avoid alcohol, caffeinated beverages, and products that contain nicotine and tobacco, especially close to bedtime. These can disrupt your sleep. Do not eat a large meal or eat spicy foods right before bedtime. This can lead to digestive discomfort that can make it hard for you to sleep. Sleep habits  Keep a sleep diary to help you and your health care provider figure out what could be causing your insomnia. Write down: When you sleep. When you wake up during the night. How well you sleep and how rested you feel the next day. Any side effects of medicines you are taking. What you eat and drink. Make your bedroom a dark, comfortable place where it is easy to fall asleep. Put up shades or blackout curtains to block light from outside. Use a white noise machine to block noise. Keep the temperature cool. Limit screen use before bedtime. This includes: Not watching TV. Not using your smartphone, tablet, or computer. Stick to a routine that includes going to bed and waking up at the same times every day and night. This can help you fall asleep faster. Consider making a quiet activity, such as reading, part of your nighttime routine. Try to avoid taking naps during  the day so that you sleep better at night. Get out of bed if you are still awake after 15 minutes of  trying to sleep. Keep the lights down, but try reading or doing a quiet activity. When you feel sleepy, go back to bed. General instructions Take over-the-counter and prescription medicines only as told by your health care provider. Exercise regularly as told by your health care provider. However, avoid exercising in the hours right before bedtime. Use relaxation techniques to manage stress. Ask your health care provider to suggest some techniques that may work well for you. These may include: Breathing exercises. Routines to release muscle tension. Visualizing peaceful scenes. Make sure that you drive carefully. Do not drive if you feel very sleepy. Keep all follow-up visits. This is important. Contact a health care provider if: You are tired throughout the day. You have trouble in your daily routine due to sleepiness. You continue to have sleep problems, or your sleep problems get worse. Get help right away if: You have thoughts about hurting yourself or someone else. Get help right away if you feel like you may hurt yourself or others, or have thoughts about taking your own life. Go to your nearest emergency room or: Call 911. Call the [facility] at [phone] or 988. This is open 24 hours a day. Text the Crisis Text Line at 803-555-6382. Summary Insomnia is a sleep disorder that makes it difficult to fall asleep or stay asleep. Insomnia can be long-term (chronic) or short-term (acute). Treatment for insomnia depends on the cause. Treatment may focus on treating an underlying condition that is causing the insomnia. Keep a sleep diary to help you and your health care provider figure out what could be causing your insomnia. This information is not intended to replace advice given to you by your health care provider. Make sure you discuss any questions you have with your health care provider. Document Revised: 09/04/2021 Document Reviewed: 09/04/2021 Elsevier  Patient Education  [place].

## 2022-06-12 ENCOUNTER — Encounter (HOSPITAL_COMMUNITY): Payer: Self-pay | Admitting: Emergency Medicine

## 2022-06-12 ENCOUNTER — Ambulatory Visit (HOSPITAL_COMMUNITY)
Admission: EM | Admit: 2022-06-12 | Discharge: 2022-06-12 | Disposition: A | Discharge status: Home / Self Care | Payer: BC Managed Care – PPO | Attending: Family Medicine | Admitting: Family Medicine

## 2022-06-12 DIAGNOSIS — M545 Low back pain, unspecified: Secondary | ICD-10-CM | POA: Insufficient documentation

## 2022-06-12 DIAGNOSIS — B3731 Acute candidiasis of vulva and vagina: Secondary | ICD-10-CM | POA: Insufficient documentation

## 2022-06-12 DIAGNOSIS — N760 Acute vaginitis: Secondary | ICD-10-CM | POA: Insufficient documentation

## 2022-06-12 DIAGNOSIS — N898 Other specified noninflammatory disorders of vagina: Secondary | ICD-10-CM | POA: Insufficient documentation

## 2022-06-12 LAB — POCT URINALYSIS DIPSTICK, ED / UC
Bilirubin Urine: NEGATIVE
Glucose, UA: NEGATIVE mg/dL
Ketones, ur: NEGATIVE mg/dL
Leukocytes,Ua: NEGATIVE
Nitrite: NEGATIVE
Protein, ur: NEGATIVE mg/dL
Specific Gravity, Urine: <=1.005 (ref 1.005–1.030)
Urobilinogen, UA: 0.2 mg/dL (ref 0.0–1.0)
pH: 6 (ref 5.0–8.0)

## 2022-06-12 MED ORDER — CEPHALEXIN 500 MG PO CAPS: 500.0000 mg | ORAL_CAPSULE | Freq: Two times a day (BID) | ORAL | 0 refills | Status: DC

## 2022-06-12 MED ORDER — FLUCONAZOLE 150 MG PO TABS: ORAL_TABLET | ORAL | 0 refills | Status: DC

## 2022-06-12 NOTE — ED Triage Notes (Signed)
Pt reports lower right flank and back pain for the past several days. States she took a UTI test and it was positive for leukocytes. Also reports mild dysuria, vaginal itching and thin discharge.

## 2022-06-12 NOTE — Discharge Instructions (Addendum)
You have had labs (urine culture and vaginal cytology) sent today. We will call you with any significant abnormalities or if there is need to begin or change treatment or pursue further follow up.  You may also review your test results online through [facility]. If you do not have a MyChart account, instructions to sign up should be on your discharge paperwork.

## 2022-06-13 ENCOUNTER — Encounter: Payer: Self-pay | Admitting: Internal Medicine

## 2022-06-13 LAB — CERVICOVAGINAL ANCILLARY ONLY
Bacterial Vaginitis (gardnerella): POSITIVE — AB
Candida Glabrata: NEGATIVE
Candida Vaginitis: POSITIVE — AB
Chlamydia: NEGATIVE
Comment: NEGATIVE
Comment: NEGATIVE
Comment: NEGATIVE
Comment: NEGATIVE
Comment: NEGATIVE
Comment: NORMAL
Neisseria Gonorrhea: NEGATIVE
Trichomonas: NEGATIVE

## 2022-06-13 NOTE — ED Provider Notes (Signed)
[place]    ASSESSMENT & PLAN:  1. Acute low back pain without sciatica, unspecified back pain laterality   2. Itching in the vaginal area    Will begin empiric tx for possible UTI and yeast infection. Vaginal cytology pending.  Meds ordered this encounter  Medications   cephALEXin (KEFLEX) 500 MG capsule    Sig: Take 1 capsule (500 mg total) by mouth 2 (two) times daily.    Dispense:  10 capsule    Refill:  0   fluconazole (DIFLUCAN) 150 MG tablet    Sig: Take one tablet by mouth as a single dose. May repeat in 3 days if symptoms persist.    Dispense:  2 tablet    Refill:  0   No signs of pyelonephritis. Urine culture sent. Will follow up with her PCP or here if not showing improvement over the next 48 hours, sooner if needed.  Outlined signs and symptoms indicating need for more acute intervention. Patient verbalized understanding. After Visit Summary given.  SUBJECTIVE:  Jennifer Randall is a 31 y.o. female who complains of R flank discomfort/back pain; x few days; h/o pyelo with similar symptoms. Afebrile. No vaginal bleeding. With vaginal irritation/itching. Gross hematuria: not present. No specific aggravating or alleviating factors reported. No LE edema. Normal PO intake without n/v/d. Without specific abdominal pain. Ambulatory without difficulty. OTC UTI test with + leuk.   OBJECTIVE:  Vitals:   06/12/22 2007  BP: 109/64  Pulse: 79  Resp: 18  Temp: 98.1 F (36.7 C)  TempSrc: Oral  SpO2: 100%   General appearance: alert; no distress HENT: oropharynx: moist Lungs: unlabored respirations Abdomen: soft, non-tender; bowel sounds normal; no masses or organomegaly; no guarding or rebound tenderness Back: no CVA tenderness Extremities: no edema; symmetrical with no gross deformities Skin: warm and dry Neurologic: normal gait Psychological: alert and cooperative; normal mood and affect  Labs Reviewed  POCT URINALYSIS DIPSTICK, ED / UC -  Abnormal; Notable for the following components:      Result Value   Hgb urine dipstick SMALL (*)    All other components within normal limits  URINE CULTURE  CERVICOVAGINAL ANCILLARY ONLY    Allergies  Allergen Reactions   Sulfa Antibiotics Hives    Past Medical History:  Diagnosis Date   Depression    Heart murmur    Social History   Socioeconomic History   Marital status: Married    Spouse name: Not on file   Number of children: Not on file   Years of education: Not on file   Highest education level: Not on file  Occupational History   Not on file  Tobacco Use   Smoking status: Never   Smokeless tobacco: Never  Vaping Use   Vaping Use: Never used  Substance and Sexual Activity   Alcohol use: Not Currently    Alcohol/week: 0.0 standard drinks of alcohol   Drug use: No   Sexual activity: Yes    Partners: Male    Birth control/protection: None    Comment: No protection, married  Other Topics Concern   Not on file  Social History Narrative   Not on file   Social Determinants of Health   Financial Resource Strain: Not on file  Food Insecurity: Not on file  Transportation Needs: Not on file  Physical Activity: Not on file  Stress: Not on file  Social Connections: Not on file  Intimate Partner Violence: Not on file   Family History  Problem Relation Age of Onset   Cancer Father    Hyperlipidemia Maternal Izetta Dakin, MD 06/13/22 [phone]

## 2022-06-14 ENCOUNTER — Telehealth (HOSPITAL_COMMUNITY): Payer: Self-pay | Admitting: Emergency Medicine

## 2022-06-14 LAB — URINE CULTURE: Culture: 10000 — AB

## 2022-06-14 MED ORDER — METRONIDAZOLE 500 MG PO TABS: 500.0000 mg | ORAL_TABLET | Freq: Two times a day (BID) | ORAL | 0 refills | Status: DC

## 2022-06-18 ENCOUNTER — Ambulatory Visit: Payer: BC Managed Care – PPO | Admitting: Family Medicine

## 2022-06-18 ENCOUNTER — Encounter: Payer: Self-pay | Admitting: Family Medicine

## 2022-06-18 VITALS — BP 98/60 | HR 84 | Temp 98.6°F | Wt 111.6 lb

## 2022-06-18 DIAGNOSIS — N760 Acute vaginitis: Secondary | ICD-10-CM | POA: Diagnosis not present

## 2022-06-18 DIAGNOSIS — B9689 Other specified bacterial agents as the cause of diseases classified elsewhere: Secondary | ICD-10-CM | POA: Diagnosis not present

## 2022-06-18 DIAGNOSIS — R1011 Right upper quadrant pain: Secondary | ICD-10-CM | POA: Diagnosis not present

## 2022-06-18 NOTE — Patient Instructions (Signed)
Pay attention to see if any foods make a difference specifically greasy foods.  You can take 2 Tylenol 4 times per day and you can also add 2 Aleve twice per day for pain relief.Jennifer Randall the  antibiotic and the Diflucan

## 2022-06-18 NOTE — Progress Notes (Signed)
   Subjective:    Patient ID: Jennifer Randall, female    DOB: 11-05-1990, 31 y.o.   MRN: [id number]  HPI She is here for evaluation of several issues.  She has a several month history of difficulty with vaginal discharge as well as right-sided back pain.  This got much worse and she was seen in an urgent care center.  She was treated there for BV with metronidazole and also given Diflucan.  She has not gotten the Diflucan prescription filled.  She states that the right sided back pain is now more right upper quadrant pain that radiates into her back.  Food seems to make this worse.  She is not sure what kind of food makes a difference.   Review of Systems     Objective:   Physical Exam Alert and in no distress. Tympanic membranes and canals are normal. Pharyngeal area is normal. Neck is supple without adenopathy or thyromegaly. Cardiac exam shows a regular sinus rhythm without murmurs or gallops. Lungs are clear to auscultation. Abdominal exam shows right upper quadrant tenderness with a positive Murphy sign.  Murphy's punch was negative.       Assessment & Plan:  Right upper quadrant pain - Plan: US Abdomen Limited RUQ (LIVER/GB)  BV (bacterial vaginosis) I explained that I wanted her to finish the dosing of the metronidazole as well as getting the Diflucan filled and taking that.  Hopefully that will take care of her vaginal type symptoms.  I then discussed the right upper quadrant pain and now this seems to be more of a gallbladder issue and will therefore follow-up after the ultrasound was done.  She was comfortable with that.

## 2022-06-19 ENCOUNTER — Other Ambulatory Visit: Payer: BC Managed Care – PPO

## 2022-06-19 ENCOUNTER — Ambulatory Visit
Admission: RE | Admit: 2022-06-19 | Discharge: 2022-06-19 | Disposition: A | Discharge status: Home / Self Care | Payer: BC Managed Care – PPO | Source: Ambulatory Visit | Attending: Family Medicine | Admitting: Family Medicine

## 2022-06-19 ENCOUNTER — Other Ambulatory Visit: Payer: Self-pay

## 2022-06-19 DIAGNOSIS — R1011 Right upper quadrant pain: Secondary | ICD-10-CM

## 2022-06-19 NOTE — Addendum Note (Signed)
Addended by: Denita Lung on: 06/19/2022 12:32 PM   Modules accepted: Orders

## 2022-06-20 ENCOUNTER — Encounter: Payer: Self-pay | Admitting: Family Medicine

## 2022-06-28 ENCOUNTER — Ambulatory Visit (HOSPITAL_COMMUNITY)
Admission: RE | Admit: 2022-06-28 | Discharge: 2022-06-28 | Disposition: A | Discharge status: Home / Self Care | Payer: BC Managed Care – PPO | Source: Ambulatory Visit | Attending: Family Medicine | Admitting: Family Medicine

## 2022-06-28 DIAGNOSIS — R1011 Right upper quadrant pain: Secondary | ICD-10-CM | POA: Insufficient documentation

## 2022-06-28 MED ORDER — TECHNETIUM TC 99M MEBROFENIN IV KIT
5.3000 | PACK | Freq: Once | INTRAVENOUS | Status: AC | PRN
  Administered 2022-06-28: 5.3 via INTRAVENOUS

## 2022-07-03 ENCOUNTER — Telehealth: Payer: BC Managed Care – PPO | Admitting: Family Medicine

## 2022-07-03 ENCOUNTER — Encounter: Payer: Self-pay | Admitting: Family Medicine

## 2022-07-03 ENCOUNTER — Other Ambulatory Visit: Payer: Self-pay | Admitting: Family Medicine

## 2022-07-03 VITALS — Ht 63.0 in | Wt 111.0 lb

## 2022-07-03 DIAGNOSIS — R109 Unspecified abdominal pain: Secondary | ICD-10-CM

## 2022-07-03 MED ORDER — HYOSCYAMINE SULFATE ER 0.375 MG PO TB12: 0.3750 mg | ORAL_TABLET | Freq: Two times a day (BID) | ORAL | 0 refills | Status: DC

## 2022-07-03 NOTE — Progress Notes (Signed)
   Subjective:    Patient ID: Jennifer Randall, female    DOB: 01-20-1991, 31 y.o.   MRN: [id number]  HPI Documentation for virtual audio and video telecommunications through [place] encounter: The patient was located at home. 2 patient identifiers used.  The provider was located in the office. The patient did consent to this visit and is aware of possible charges through their insurance for this visit. The other persons participating in this telemedicine service were none. Time spent on call was 5 minutes and in review of previous records >15 minutes total for counseling and coordination of care. This virtual service is not related to other E/M service within previous 7 days.  She has been recently evaluated for gallbladder disease all the tests were negative.  She has been trying to make some dietary changes to see if any particular foods are causing trouble.  So far she has not been able to totally isolate this.  She does note that the pain comes on usually 20 to 30 minutes after she ate and is cramping in nature.  Review of Systems     Objective:   Physical Exam Alert and in no distress otherwise not examined       Assessment & Plan:  Abdominal cramping - Plan: hyoscyamine (LEVBID) 0.375 MG 12 hr tablet I explained that I thought she was having some cramping abdominal pain and is reasonable to write the antispasm medication and explained that it could make her slightly drowsy.  She is to use this for at least a week and also attention to see if there are any foods that might be causing difficulty and as such that might be what needs to be avoided.  If she continues have difficulty I will refer to GI for further evaluation.  She was comfortable with that.

## 2022-07-04 NOTE — Telephone Encounter (Signed)
[facility] is requesting to fill pt hyoscyamine. Please advise [facility]

## 2022-07-15 ENCOUNTER — Other Ambulatory Visit: Payer: Self-pay | Admitting: Family Medicine

## 2022-07-15 DIAGNOSIS — R109 Unspecified abdominal pain: Secondary | ICD-10-CM

## 2022-07-30 ENCOUNTER — Encounter: Payer: Self-pay | Admitting: Internal Medicine

## 2022-10-08 NOTE — L&D Delivery Note (Signed)
Delivery Note After approximately 15 minutes of pushing, at 11:34 AM a healthy female "Jennifer Randall" was delivered via Vaginal, Spontaneous (Presentation: Left Occiput Anterior). He was vigorous on delivery and placed skin to skin on mother's abdomen. The cord was clamped and cut after 1 minute. APGAR: 8, 9; weight pending Placenta status: Spontaneous, Intact.  Cord: 3 vessels with the following complications: None.  Cord pH: N/A  Anesthesia: Epidural Episiotomy: None Lacerations: Left Labial;1st degree;Perineal Suture Repair: 3-0 vicryl, 4-0 monocryl Est. Blood Loss (mL):  152  Mom to postpartum.  Baby to Couplet care / Skin to Skin.  Junius Creamer 10/05/2023, 12:05 PM

## 2022-11-27 ENCOUNTER — Ambulatory Visit: Payer: BC Managed Care – PPO | Admitting: Medical

## 2022-11-27 VITALS — BP 100/60 | HR 82 | Temp 97.5°F | Wt 110.8 lb

## 2022-11-27 DIAGNOSIS — N898 Other specified noninflammatory disorders of vagina: Secondary | ICD-10-CM | POA: Diagnosis not present

## 2022-11-27 DIAGNOSIS — R300 Dysuria: Secondary | ICD-10-CM

## 2022-11-27 LAB — POCT WET PREP (WET MOUNT)
Clue Cells Wet Prep Whiff POC: NEGATIVE
Trichomonas Wet Prep HPF POC: ABSENT
WBC, Wet Prep HPF POC: NEGATIVE

## 2022-11-27 LAB — POCT URINALYSIS DIP (PROADVANTAGE DEVICE)
Bilirubin, UA: NEGATIVE
Glucose, UA: NEGATIVE mg/dL
Ketones, POC UA: NEGATIVE mg/dL
Leukocytes, UA: NEGATIVE
Nitrite, UA: NEGATIVE
Protein Ur, POC: NEGATIVE mg/dL
Specific Gravity, Urine: 1.01
Urobilinogen, Ur: NEGATIVE
pH, UA: 6 (ref 5.0–8.0)

## 2022-11-27 LAB — POCT URINE PREGNANCY: Preg Test, Ur: NEGATIVE

## 2022-11-27 MED ORDER — FLUCONAZOLE 100 MG PO TABS: 100.0000 mg | ORAL_TABLET | Freq: Every day | ORAL | 0 refills | Status: DC

## 2022-11-27 NOTE — Progress Notes (Unsigned)
Subjective:  Jennifer Randall is a 32 y.o. female who presents for Chief Complaint  Patient presents with   possible uti    Pt complains of frequent urination and cloudy color in pee. Frequent and runny discharge. Yellowish tint. Not sure if its a uti. Experiences no pain. Notices abnormal pee smell.      Here for possible UTI.  She has her young child with her.  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  Had some yellowish watery discharge last week.  Odor in urine.   Has had BV in the past, UTI in the past.  No fever, no NVD.  No back pain, but has some mid to suprapubic tendnerss.   No recent changes in soap or hygiene products.  Married. No concern for STD.  No birth control.  LMP 11/10/22.  Periods are regular.  No other aggravating or relieving factors.    No other c/o.  Past Medical History:  Diagnosis Date   Depression    Heart murmur    No current outpatient medications on file prior to visit.   No current facility-administered medications on file prior to visit.     The following portions of the patient's history were reviewed and updated as appropriate: allergies, current medications, past family history, past medical history, past social history, past surgical history and problem list.  ROS Otherwise as in subjective above    Objective: BP 100/60   Pulse 82   Temp (!) 97.5 F (36.4 C)   Wt 110 lb 12.8 oz (50.3 kg)   BMI 19.63 kg/m   General appearance: alert, no distress, well developed, well nourished GU/gyn - deferred/declined    Assessment: Encounter Diagnoses  Name Primary?   Dysuria Yes   Vaginal discharge      Plan: Urine reviewed, and she did a self wet prep today since her daughter was with her.   We didn't feel comfortable getting her undressed, so my CMA watched her child while she did a self wet prep swab in the bathroom  Wet prep shows many yeast, minimal clue cells.    Urine  culture sent.  Urine pregnancy negative  Begin diflucan, await culture  Jennifer Randall was seen today for possible uti.  Diagnoses and all orders for this visit:  Dysuria -     Urine Culture -     POCT urine pregnancy -     POCT Urinalysis DIP (Proadvantage Device)  Vaginal discharge -     POCT Wet Prep [facility])  Other orders -     fluconazole (DIFLUCAN) 100 MG tablet; Take 1 tablet (100 mg total) by mouth daily.    Follow up: pending labs

## 2022-11-29 NOTE — Progress Notes (Signed)
Urine culture preliminary does show some bacteria.  If still not a lot of improvement yet lets add an antibiotic.  Please find out how the symptoms are currently

## 2022-11-30 ENCOUNTER — Other Ambulatory Visit: Payer: Self-pay | Admitting: Medical

## 2022-11-30 ENCOUNTER — Telehealth: Payer: Self-pay | Admitting: Family Medicine

## 2022-11-30 LAB — URINE CULTURE

## 2022-11-30 MED ORDER — NITROFURANTOIN MONOHYD MACRO 100 MG PO CAPS: 100.0000 mg | ORAL_CAPSULE | Freq: Two times a day (BID) | ORAL | 0 refills | Status: DC

## 2022-11-30 NOTE — Progress Notes (Signed)
Urine culture shows urinary tract infection and that that Jennifer Randall should work.  Make sure she aware to stop the Macrobid antibiotic urinary tract infection

## 2022-11-30 NOTE — Telephone Encounter (Signed)
Pt called back today to let you know, her urine still smells and she feels discomfort in her bladder area.

## 2023-02-22 LAB — HM HEPATITIS C SCREENING LAB: HM Hepatitis Screen: NEGATIVE

## 2023-02-22 LAB — OB RESULTS CONSOLE GC/CHLAMYDIA
Chlamydia: NEGATIVE
Neisseria Gonorrhea: NEGATIVE

## 2023-02-22 LAB — OB RESULTS CONSOLE HIV ANTIBODY (ROUTINE TESTING): HIV: NONREACTIVE

## 2023-02-22 LAB — HM HIV SCREENING LAB: HM HIV Screening: NEGATIVE

## 2023-02-22 LAB — HEPATITIS C ANTIBODY: HCV Ab: NEGATIVE

## 2023-02-22 LAB — OB RESULTS CONSOLE RUBELLA ANTIBODY, IGM: Rubella: IMMUNE

## 2023-02-22 LAB — OB RESULTS CONSOLE ANTIBODY SCREEN: Antibody Screen: NEGATIVE

## 2023-02-22 LAB — OB RESULTS CONSOLE RPR: RPR: NONREACTIVE

## 2023-02-22 LAB — OB RESULTS CONSOLE HEPATITIS B SURFACE ANTIGEN: Hepatitis B Surface Ag: NEGATIVE

## 2023-08-26 ENCOUNTER — Ambulatory Visit: Payer: BC Managed Care – PPO | Admitting: Family Medicine

## 2023-08-26 ENCOUNTER — Encounter: Payer: Self-pay | Admitting: Family Medicine

## 2023-08-26 ENCOUNTER — Other Ambulatory Visit: Payer: Self-pay | Admitting: *Deleted

## 2023-08-26 ENCOUNTER — Other Ambulatory Visit: Payer: BC Managed Care – PPO

## 2023-08-26 VITALS — BP 120/60 | HR 88 | Temp 99.2°F | Ht 63.0 in | Wt 146.0 lb

## 2023-08-26 DIAGNOSIS — R0981 Nasal congestion: Secondary | ICD-10-CM

## 2023-08-26 DIAGNOSIS — J0100 Acute maxillary sinusitis, unspecified: Secondary | ICD-10-CM | POA: Diagnosis not present

## 2023-08-26 DIAGNOSIS — J3489 Other specified disorders of nose and nasal sinuses: Secondary | ICD-10-CM

## 2023-08-26 LAB — POC COVID19 BINAXNOW: SARS Coronavirus 2 Ag: NEGATIVE

## 2023-08-26 MED ORDER — AMOXICILLIN 500 MG PO TABS: 1000.0000 mg | ORAL_TABLET | Freq: Two times a day (BID) | ORAL | 0 refills | Status: DC

## 2023-08-26 NOTE — Patient Instructions (Signed)
  Stay well hydrated.  I recommend using sinus rinses once or twice daily as needed for sinus pain. Sinus rinse kit (NeilMed) or Neti-pot, with distilled or boiled water. This should help clear out the sinuses and decrease sinus pain.  If your mucus clears pretty quickly, and remains clear, you can wait on an antibiotic. If you do the sinus rinses more than 2x and it remains discolored, start the antibiotic. If you have persistent fever, persistent discolored mucus and pain, take the antibiotic.  You may use guaifenesin (the expectorant found in Mucinex and robitussin) to help keep the mucus and phlegm thin, which helps the sinuses drain, and helps prevent cough once it gets to your throat and chest . The DM versions (of mucinex or Robitussin) are also safe in pregnancy, if needed. DM is a cough suppressant--doesn't seem like you need this now.

## 2023-08-26 NOTE — Progress Notes (Signed)
Chief Complaint  Patient presents with   Sore Throat    Started with "cold symptoms" last Wed. Scratcy throat and drainage. Stayed home from work Th/Fri and today. Her face is tender, forehead, teeth. Lots of discolored mucus. Using saline nasal rinses. No fever. Has red cheeks and red spot on left side of face.     [redacted] weeks pregnant, reports uncomplicated pregnancy (3rd pregnancy).  Cold symptoms started Wednesday 11/13--scratchy sore throat, lost voice later that day. ST resolved quickly.  Mucus was clear at first.  Got more discolored over the weekend. Yesterday she developed pain in her upper teeth, cheeks, and across her forehead. Today she had redness of both cheeks.  Noticed a spot on her left cheek last night, got bigger this morning. Sinuses less painful today.  Hot compresses, hot showers and nasal saline has helped. She also got a humidifier yesterday. Started elevated HOB last night--had more HA when laying flat. She denies any significant cough. Feels similar to prior sinus infections.  No known fever, no chills. No diarrhea.  Vomited last Wednesday after eating chili (and moved sudden movement). No nausea.  No fall allergies, just spring allergies.   PMH, PSH, SH reviewed  Saw OB on Friday, all good.  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   [DISCONTINUED] fluconazole (DIFLUCAN) 100 MG tablet Take 1 tablet (100 mg total) by mouth daily.   [DISCONTINUED] nitrofurantoin, macrocrystal-monohydrate, (MACROBID) 100 MG capsule Take 1 capsule (100 mg total) by mouth 2 (two) times daily.   No facility-administered encounter medications on file as of 08/26/2023.   Allergies  Allergen Reactions   Sulfa Antibiotics Hives    ROS: URI symptoms per HPI. No known fever, chills, nausea, bowel  changes. Chest pain, shortness of breath. No significant cough. Headaches per HPI (sinus only, improved today). No edema, bleeding, bruising, rashes.    PHYSICAL EXAM:  BP 120/60   Pulse 88   Temp 99.2 F (37.3 C) (Tympanic)   Ht 5\' 3"  (1.6 m)   Wt 146 lb (66.2 kg)   LMP 12/16/2022   BMI 25.86 kg/m   Pleasant, well appearing, gravid female, in no distress. HEENT: conjunctiva and sclera are clear, EOMI. TM's and EAC's normal. There is a focal erythematous macule L outer/upper cheek, nontender. No other facial erythema, no swelling present. Nasal mucosa is mild-mod edematous, no erythema, no purulence Sinuses--tender at L cheek, nontender elsewhere OP is clear Neck: no lymphadenopathy or mass Lungs are clear bilaterally Abdomen: gravid Extremities: no edema Psych: normal mood, affect, hygiene and grooming Neuro: alert and oriented, cranial nerves grossly intact, normal gait.   COVID test negative    ASSESSMENT/PLAN:  Sinus pain - Ddx reviewed--allergies, URI, vs early bacterial infection. Rec mucinex, sinus rinses. Start ABX only if persistent/worsening sx - Plan: POC COVID-19  Acute non-recurrent maxillary sinusitis - Plan: amoxicillin (AMOXIL) 500 MG tablet   Reviewed safety in pregnancy of all recommended OTC treatments and ABX.  I spent 35 minutes dedicated to the care of this patient, including pre-visit review of records, face to face time, post-visit ordering of testing and documentation.  Stay well hydrated.  I recommend using sinus rinses once or twice daily as needed for sinus pain. Sinus rinse kit (NeilMed) or Neti-pot, with distilled or boiled water. This should help clear out the sinuses  and decrease sinus pain.  If your mucus clears pretty quickly, and remains clear, you can wait on an antibiotic. If you do the sinus rinses more than 2x and it remains discolored, start the antibiotic. If you have persistent fever, persistent discolored mucus and pain,  take the antibiotic.  You may use guaifenesin (the expectorant found in Mucinex and robitussin) to help keep the mucus and phlegm thin, which helps the sinuses drain, and helps prevent cough once it gets to your throat and chest . The DM versions (of mucinex or Robitussin) are also safe in pregnancy, if needed. DM is a cough suppressant--doesn't seem like you need this now.

## 2023-08-27 ENCOUNTER — Encounter: Payer: Self-pay | Admitting: *Deleted

## 2023-09-20 LAB — OB RESULTS CONSOLE GBS: GBS: NEGATIVE

## 2023-09-22 ENCOUNTER — Encounter (HOSPITAL_COMMUNITY): Payer: Self-pay | Admitting: *Deleted

## 2023-09-22 ENCOUNTER — Inpatient Hospital Stay (HOSPITAL_COMMUNITY)
Admission: AD | Admit: 2023-09-22 | Discharge: 2023-09-22 | Disposition: A | Discharge status: Home / Self Care | Payer: BC Managed Care – PPO | Attending: Obstetrics | Admitting: Obstetrics

## 2023-09-22 DIAGNOSIS — O471 False labor at or after 37 completed weeks of gestation: Secondary | ICD-10-CM | POA: Diagnosis present

## 2023-09-22 DIAGNOSIS — Z3A37 37 weeks gestation of pregnancy: Secondary | ICD-10-CM | POA: Insufficient documentation

## 2023-09-22 NOTE — Discharge Instructions (Signed)
You were seen tonight for contractions without cervical change. Rest as you are able. You may use forward leaning positions and the exercises below to encourage baby into a better position to promote labor.   Try the [employer] at [email or web address].[email or web address] daily to improve baby's position and encourage the onset of labor.  You may also look at [employer] for the "Abdominal Lift"

## 2023-09-22 NOTE — MAU Note (Signed)
.  Jennifer Randall is a 32 y.o. at [redacted]w[redacted]d here in MAU reporting: ctx since this afternoon about 5 min apart. Denies any vag bleeding or leaking. Good fetal movement felt. Dilated/50 on Friday LMP:  Onset of complaint: 1200 Pain score: 4 Vitals:   09/22/23 1939  BP: 106/60  Pulse: 80  Resp: 18  Temp: 98 F (36.7 C)     FHT:147 Lab orders placed from triage:

## 2023-09-27 ENCOUNTER — Telehealth (HOSPITAL_COMMUNITY): Payer: Self-pay | Admitting: *Deleted

## 2023-09-27 ENCOUNTER — Encounter (HOSPITAL_COMMUNITY): Payer: Self-pay | Admitting: *Deleted

## 2023-09-27 NOTE — Telephone Encounter (Signed)
Preadmission screen  

## 2023-09-30 ENCOUNTER — Other Ambulatory Visit: Payer: Self-pay | Admitting: Obstetrics and Gynecology

## 2023-09-30 DIAGNOSIS — O321XX0 Maternal care for breech presentation, not applicable or unspecified: Secondary | ICD-10-CM

## 2023-10-01 ENCOUNTER — Observation Stay (HOSPITAL_COMMUNITY): Payer: BC Managed Care – PPO

## 2023-10-01 ENCOUNTER — Encounter (HOSPITAL_COMMUNITY): Payer: Self-pay

## 2023-10-01 ENCOUNTER — Encounter (HOSPITAL_COMMUNITY): Payer: Self-pay | Admitting: Anesthesiology

## 2023-10-01 ENCOUNTER — Inpatient Hospital Stay (HOSPITAL_COMMUNITY)
Admission: RE | Admit: 2023-10-01 | Discharge: 2023-10-01 | DRG: 806 | Disposition: A | Discharge status: Home / Self Care | Payer: BC Managed Care – PPO | Attending: Obstetrics and Gynecology | Admitting: Obstetrics and Gynecology

## 2023-10-01 DIAGNOSIS — O321XX0 Maternal care for breech presentation, not applicable or unspecified: Secondary | ICD-10-CM

## 2023-10-01 DIAGNOSIS — O322XX0 Maternal care for transverse and oblique lie, not applicable or unspecified: Principal | ICD-10-CM | POA: Diagnosis present

## 2023-10-01 DIAGNOSIS — Z3A38 38 weeks gestation of pregnancy: Secondary | ICD-10-CM

## 2023-10-01 DIAGNOSIS — Z3A39 39 weeks gestation of pregnancy: Secondary | ICD-10-CM | POA: Diagnosis not present

## 2023-10-01 DIAGNOSIS — K219 Gastro-esophageal reflux disease without esophagitis: Secondary | ICD-10-CM | POA: Diagnosis present

## 2023-10-01 DIAGNOSIS — O070 Genital tract and pelvic infection following failed attempted termination of pregnancy: Secondary | ICD-10-CM | POA: Diagnosis present

## 2023-10-01 DIAGNOSIS — O9962 Diseases of the digestive system complicating childbirth: Secondary | ICD-10-CM | POA: Diagnosis present

## 2023-10-01 DIAGNOSIS — O329XX0 Maternal care for malpresentation of fetus, unspecified, not applicable or unspecified: Principal | ICD-10-CM | POA: Diagnosis present

## 2023-10-01 MED ORDER — TERBUTALINE SULFATE 1 MG/ML IJ SOLN
INTRAMUSCULAR | Status: AC
  Filled 2023-10-01: qty 1

## 2023-10-01 MED ORDER — TERBUTALINE SULFATE 1 MG/ML IJ SOLN: 0.2500 mg | Freq: Once | INTRAMUSCULAR | Status: DC

## 2023-10-01 NOTE — H&P (Signed)
Jennifer Randall is a 32 y.o. female presenting for external cephalic version  32 yo [phone] @ 38+6 presents for ECV for fetal malpresentation. US showed transverse lie in office on 09/27/23. Pregnancy otherwise uncomplicated OB History     Gravida  5   Para  2   Term  2   Preterm  0   AB  2   Living  2      SAB  2   IAB  0   Ectopic  0   Multiple  0   Live Births  2          Past Medical History:  Diagnosis Date   Depression    Heart murmur    Past Surgical History:  Procedure Laterality Date   DILATION AND EVACUATION N/A 03/22/2021   Procedure: DILATATION AND EVACUATION;  Surgeon: Marlow Baars, MD;  Location: [facility] OR;  Service: Gynecology;  Laterality: N/A;   TONSILLECTOMY     TYMPANOSTOMY TUBE PLACEMENT     Family History: family history includes Cancer in her father; Hyperlipidemia in her maternal grandmother. Social History:  reports that she has never smoked. She has never used smokeless tobacco. She reports that she does not currently use alcohol. She reports that she does not use drugs.     Maternal Diabetes: No Genetic Screening: Declined Maternal Ultrasounds/Referrals: Normal Fetal Ultrasounds or other Referrals:  None Maternal Substance Abuse:  No Significant Maternal Medications:  None Significant Maternal Lab Results:  None Number of Prenatal Visits:greater than 3 verified prenatal visits Other Comments:  None  Review of Systems History   Blood pressure 100/62, pulse 86, temperature 98 F (36.7 C), temperature source Oral, resp. rate 16, last menstrual period 12/16/2022. Exam Physical Exam  AOx3, NAD Abd gravid, soft FHR 140 reactive, cat 1 tracing Prenatal labs: ABO, Rh:   Antibody: Negative (05/17 0000) Rubella: Immune (05/17 0000) RPR: Nonreactive (05/17 0000)  HBsAg: Negative (05/17 0000)  HIV: Non-reactive (05/17 0000)  GBS: Negative/-- (12/13 0000)   Assessment/Plan: 1) Admit to obs 2) Proceed with ECV  attempt   Waynard Reeds 10/01/2023, 8:12 AM

## 2023-10-01 NOTE — Discharge Summary (Signed)
Discharge summary  Pt admitted for obs for ECV d/t transverse lie. NST reactive before and after procedure. BSUS showed transverse lie. ECV successful. Fetus tolerated procedure well. D/C home. Scheduled for IOL 10/03/23 for unstable lie

## 2023-10-03 ENCOUNTER — Observation Stay (HOSPITAL_COMMUNITY): Payer: BC Managed Care – PPO

## 2023-10-03 ENCOUNTER — Inpatient Hospital Stay (HOSPITAL_COMMUNITY): Payer: BC Managed Care – PPO

## 2023-10-04 ENCOUNTER — Encounter (HOSPITAL_COMMUNITY): Payer: Self-pay | Admitting: Obstetrics and Gynecology

## 2023-10-04 ENCOUNTER — Inpatient Hospital Stay (HOSPITAL_COMMUNITY)
Admission: RE | Admit: 2023-10-04 | Discharge: 2023-10-06 | Disposition: A | Discharge status: Home / Self Care | Payer: BC Managed Care – PPO | Source: Home / Self Care | Attending: Obstetrics and Gynecology | Admitting: Obstetrics and Gynecology

## 2023-10-04 ENCOUNTER — Other Ambulatory Visit (HOSPITAL_COMMUNITY): Payer: Self-pay

## 2023-10-04 ENCOUNTER — Other Ambulatory Visit: Payer: Self-pay

## 2023-10-04 DIAGNOSIS — O9962 Diseases of the digestive system complicating childbirth: Secondary | ICD-10-CM | POA: Diagnosis present

## 2023-10-04 DIAGNOSIS — K219 Gastro-esophageal reflux disease without esophagitis: Secondary | ICD-10-CM | POA: Diagnosis present

## 2023-10-04 DIAGNOSIS — O322XX0 Maternal care for transverse and oblique lie, not applicable or unspecified: Secondary | ICD-10-CM | POA: Diagnosis present

## 2023-10-04 DIAGNOSIS — Z3A39 39 weeks gestation of pregnancy: Secondary | ICD-10-CM

## 2023-10-04 HISTORY — PX: EXTERNAL CEPHALIC VERSION: OBO2

## 2023-10-04 LAB — CBC
HCT: 35.8 % — ABNORMAL LOW (ref 36.0–46.0)
Hemoglobin: 12.1 g/dL (ref 12.0–15.0)
MCH: 30.5 pg (ref 26.0–34.0)
MCHC: 33.8 g/dL (ref 30.0–36.0)
MCV: 90.2 fL (ref 80.0–100.0)
Platelets: 231 10*3/uL (ref 150–400)
RBC: 3.97 MIL/uL (ref 3.87–5.11)
RDW: 13.2 % (ref 11.5–15.5)
WBC: 12 10*3/uL — ABNORMAL HIGH (ref 4.0–10.5)
nRBC: 0 % (ref 0.0–0.2)

## 2023-10-04 LAB — TYPE AND SCREEN
ABO/RH(D): O POS
Antibody Screen: NEGATIVE

## 2023-10-04 MED ORDER — FENTANYL CITRATE (PF) 100 MCG/2ML IJ SOLN: 50.0000 ug | INTRAMUSCULAR | Status: DC | PRN

## 2023-10-04 MED ORDER — OXYCODONE-ACETAMINOPHEN 5-325 MG PO TABS: 2.0000 | ORAL_TABLET | ORAL | Status: DC | PRN

## 2023-10-04 MED ORDER — TERBUTALINE SULFATE 1 MG/ML IJ SOLN: 0.2500 mg | Freq: Once | INTRAMUSCULAR | Status: DC | PRN

## 2023-10-04 MED ORDER — ONDANSETRON HCL 4 MG/2ML IJ SOLN: 4.0000 mg | Freq: Four times a day (QID) | INTRAMUSCULAR | Status: DC | PRN

## 2023-10-04 MED ORDER — ACETAMINOPHEN 325 MG PO TABS: 650.0000 mg | ORAL_TABLET | ORAL | Status: DC | PRN

## 2023-10-04 MED ORDER — SOD CITRATE-CITRIC ACID 500-334 MG/5ML PO SOLN: 30.0000 mL | ORAL | Status: DC | PRN

## 2023-10-04 MED ORDER — OXYTOCIN-SODIUM CHLORIDE 30-0.9 UT/500ML-% IV SOLN
1.0000 m[IU]/min | INTRAVENOUS | Status: DC
  Administered 2023-10-04: 2 m[IU]/min via INTRAVENOUS
  Filled 2023-10-04 (×2): qty 500

## 2023-10-04 MED ORDER — OXYCODONE-ACETAMINOPHEN 5-325 MG PO TABS: 1.0000 | ORAL_TABLET | ORAL | Status: DC | PRN

## 2023-10-04 MED ORDER — LACTATED RINGERS IV SOLN
INTRAVENOUS | Status: DC
  Administered 2023-10-05: 125 mL/h via INTRAVENOUS

## 2023-10-04 MED ORDER — OXYTOCIN BOLUS FROM INFUSION
333.0000 mL | Freq: Once | INTRAVENOUS | Status: AC
  Administered 2023-10-05: 333 mL via INTRAVENOUS

## 2023-10-04 MED ORDER — LACTATED RINGERS IV SOLN
500.0000 mL | INTRAVENOUS | Status: DC | PRN
  Administered 2023-10-04: 500 mL via INTRAVENOUS

## 2023-10-04 MED ORDER — OXYTOCIN-SODIUM CHLORIDE 30-0.9 UT/500ML-% IV SOLN
2.5000 [IU]/h | INTRAVENOUS | Status: DC
  Administered 2023-10-05: 2.5 [IU]/h via INTRAVENOUS

## 2023-10-04 MED ORDER — LIDOCAINE HCL (PF) 1 % IJ SOLN: 30.0000 mL | INTRAMUSCULAR | Status: DC | PRN

## 2023-10-04 MED ORDER — SODIUM CHLORIDE 0.9 % IV SOLN: 2.0000 g | INTRAVENOUS | Status: DC

## 2023-10-04 MED ORDER — POVIDONE-IODINE 10 % EX SWAB: 2.0000 | Freq: Once | CUTANEOUS | Status: DC

## 2023-10-04 MED ORDER — LACTATED RINGERS IV SOLN: INTRAVENOUS | Status: DC

## 2023-10-04 NOTE — H&P (Signed)
Jennifer Randall is a 32 y.o. female presenting for scheduled induction for unstable lie. +FM, denies VB, LOF, CTX> Underwent version on 12/24, successful, however thinks baby may have turned again. PNC uncomplicated beyond prior version for transverse presentation. GBS neg OB History     Gravida  5   Para  2   Term  2   Preterm  0   AB  2   Living  2      SAB  2   IAB  0   Ectopic  0   Multiple  0   Live Births  2          Past Medical History:  Diagnosis Date   Depression    Heart murmur    Past Surgical History:  Procedure Laterality Date   DILATION AND EVACUATION N/A 03/22/2021   Procedure: DILATATION AND EVACUATION;  Surgeon: Jennifer Baars, MD;  Location: [facility] OR;  Service: Gynecology;  Laterality: N/A;   TONSILLECTOMY     TYMPANOSTOMY TUBE PLACEMENT     Family History: family history includes Cancer in her father; Hyperlipidemia in her maternal grandmother. Social History:  reports that she has never smoked. She has never used smokeless tobacco. She reports that she does not currently use alcohol. She reports that she does not use drugs.     Maternal Diabetes: No Genetic Screening: Declined Maternal Ultrasounds/Referrals: Normal Fetal Ultrasounds or other Referrals:  None Maternal Substance Abuse:  No Significant Maternal Medications:  None Significant Maternal Lab Results:  Group B Strep negative Number of Prenatal Visits:greater than 3 verified prenatal visits Maternal Vaccinations:TDap Other Comments:  None  Review of Systems  Constitutional:  Negative for chills and fever.  Respiratory:  Negative for shortness of breath.   Cardiovascular:  Negative for chest pain, palpitations and leg swelling.  Gastrointestinal:  Negative for abdominal pain, nausea and vomiting.  Neurological:  Negative for dizziness, weakness and headaches.  Psychiatric/Behavioral:  Negative for suicidal ideas.    Maternal Medical History:  Reason for admission:  Nausea.  Contractions: Frequency: irregular.   Perceived severity is mild.   Fetal activity: Perceived fetal activity is normal.   Prenatal complications: No PIH or preterm labor.   Prenatal Complications - Diabetes: none.   Dilation: 2 Effacement (%): 50 Station: -3 Exam by:: Jennifer Randall Last menstrual period 12/16/2022. Exam Physical Exam Constitutional:      General: She is not in acute distress.    Appearance: She is well-developed.  HENT:     Head: Normocephalic and atraumatic.  Eyes:     Pupils: Pupils are equal, round, and reactive to light.  Cardiovascular:     Rate and Rhythm: Normal rate and regular rhythm.     Heart sounds: No murmur heard.    No gallop.  Abdominal:     Tenderness: There is no abdominal tenderness. There is no guarding or rebound.  Genitourinary:    Vagina: Normal.  Musculoskeletal:        General: Normal range of motion.     Cervical back: Normal range of motion and neck supple.  Skin:    General: Skin is warm and dry.  Neurological:     Mental Status: She is alert and oriented to person, place, and time.    Cat 1 tracing, Baseline 135, + accels, no decels BSUS - transverse lie, back up, head in LUQ.   Prenatal labs: ABO, Rh:  A Antibody: Negative (05/17 0000) Rubella: Immune (05/17 0000) RPR: Nonreactive (05/17 0000)  HBsAg: Negative (05/17 0000)  HIV: Non-reactive (05/17 0000)  GBS: Negative/-- (12/13 0000)   Assessment/Plan: This is a 32yo G3P2002 @ 39 2/7 by LMP c/w 7wk scan admitted for iOL for unstable lie at term. Previously underwent successful ECV on 12/24, uncomplicated. Today, BSUS confirms transverse lie again. Left lateral placenta from office visit on 12/20. CE 2-3/50/ballotable consistent with transverse lie. Desires attempt at vaginal delivery, pelvis proven to 8lb6oz. Declining analgesia in form of spinal/epidural/ Aware that, should fetal bradycardia or SROM w/ cord prolapse occur during version attempt, this would mean  stat section with general anesthesia and husband would not be able to be present in OR. Patient and her husband understand. Reviewed c-section risks at this time. R/B/A of cesarean section discussed with patient. Risks of section include infection of the uterus, pelvic organs, or skin, inadvertent injury to internal organs, such as bowel or bladder. If there is major injury, extensive surgery may be required. If injury is minor, it may be treated with relative ease. Discussed possibility of excessive blood loss and transfusion. If bleeding cannot be controlled using medical or minor surgical methods, a cesarean hysterectomy may be performed which would mean no future fertility. Patient accepts the possibility of blood transfusion, if necessary.   Will wait for admission labs to return prior to trial of ECV. Anesthesia and OR charge RN aware. All questions answered in full.   Valerie Roys Taurus Randall 10/04/2023, 3:18 PM

## 2023-10-04 NOTE — Procedures (Signed)
BSUs confirms transverse lie. Given multip uterus, quiet, no IM terbutaline given. First attempt at 1612. Forward roll clockwise x1 able to successful turn infant to vertex, back up. FHR tracing Cat 1 before and after procedure. CE recheck - prior exam incorrect, external os is 2-3cm however internal is still only 1cm, unable to comfortable AROM. Amenable to pitocin. Will begin 2x2

## 2023-10-04 NOTE — Progress Notes (Signed)
Per pt request 2/2 fetal movement, BSUS confirms still vertex, deferred Ce BP 97/62   Pulse 83   Temp 98.5 F (36.9 C) (Oral)   Resp 19   Ht 5\' 3"  (1.6 m)   Wt 69.9 kg   LMP 12/16/2022   BMI 27.28 kg/m

## 2023-10-04 NOTE — Progress Notes (Signed)
Pt endorses clear gush of fluid approx 36m ago. Ce confirms vtx presentation, now 2/50/-3, more midposition. Pitocin at 12 mU/min, TOCO q2-17min, cat 1 tracing. Continue to titrate, anticipate Svd BP (!) 93/56 (BP Location: Right Arm)   Pulse 88   Temp 98.1 F (36.7 C) (Oral)   Resp 19   Ht 5\' 3"  (1.6 m)   Wt 69.9 kg   LMP 12/16/2022   BMI 27.28 kg/m

## 2023-10-05 ENCOUNTER — Inpatient Hospital Stay (HOSPITAL_COMMUNITY): Payer: BC Managed Care – PPO | Admitting: Anesthesiology

## 2023-10-05 ENCOUNTER — Encounter (HOSPITAL_COMMUNITY): Payer: Self-pay | Admitting: Obstetrics and Gynecology

## 2023-10-05 LAB — RPR: RPR Ser Ql: NONREACTIVE

## 2023-10-05 MED ORDER — PHENYLEPHRINE 80 MCG/ML (10ML) SYRINGE FOR IV PUSH (FOR BLOOD PRESSURE SUPPORT)
80.0000 ug | PREFILLED_SYRINGE | INTRAVENOUS | Status: DC | PRN
  Filled 2023-10-05: qty 10

## 2023-10-05 MED ORDER — EPHEDRINE 5 MG/ML INJ: 10.0000 mg | INTRAVENOUS | Status: DC | PRN

## 2023-10-05 MED ORDER — ZOLPIDEM TARTRATE 5 MG PO TABS: 5.0000 mg | ORAL_TABLET | Freq: Every evening | ORAL | Status: DC | PRN

## 2023-10-05 MED ORDER — SIMETHICONE 80 MG PO CHEW: 80.0000 mg | CHEWABLE_TABLET | ORAL | Status: DC | PRN

## 2023-10-05 MED ORDER — COCONUT OIL OIL
1.0000 | TOPICAL_OIL | Status: DC | PRN
  Administered 2023-10-05: 1 via TOPICAL

## 2023-10-05 MED ORDER — ONDANSETRON HCL 4 MG/2ML IJ SOLN: 4.0000 mg | INTRAMUSCULAR | Status: DC | PRN

## 2023-10-05 MED ORDER — DIPHENHYDRAMINE HCL 50 MG/ML IJ SOLN: 12.5000 mg | INTRAMUSCULAR | Status: DC | PRN

## 2023-10-05 MED ORDER — DIPHENHYDRAMINE HCL 25 MG PO CAPS: 25.0000 mg | ORAL_CAPSULE | Freq: Four times a day (QID) | ORAL | Status: DC | PRN

## 2023-10-05 MED ORDER — OXYCODONE HCL 5 MG PO TABS: 5.0000 mg | ORAL_TABLET | ORAL | Status: DC | PRN

## 2023-10-05 MED ORDER — BENZOCAINE-MENTHOL 20-0.5 % EX AERO
1.0000 | INHALATION_SPRAY | CUTANEOUS | Status: DC | PRN
  Administered 2023-10-05: 1 via TOPICAL
  Filled 2023-10-05: qty 56

## 2023-10-05 MED ORDER — PHENYLEPHRINE 80 MCG/ML (10ML) SYRINGE FOR IV PUSH (FOR BLOOD PRESSURE SUPPORT): 80.0000 ug | PREFILLED_SYRINGE | INTRAVENOUS | Status: DC | PRN

## 2023-10-05 MED ORDER — ACETAMINOPHEN 325 MG PO TABS: 650.0000 mg | ORAL_TABLET | ORAL | Status: DC | PRN

## 2023-10-05 MED ORDER — WITCH HAZEL-GLYCERIN EX PADS: 1.0000 | MEDICATED_PAD | CUTANEOUS | Status: DC | PRN

## 2023-10-05 MED ORDER — IBUPROFEN 600 MG PO TABS
600.0000 mg | ORAL_TABLET | Freq: Four times a day (QID) | ORAL | Status: DC
  Administered 2023-10-05 – 2023-10-06 (×4): 600 mg via ORAL
  Filled 2023-10-05 (×4): qty 1

## 2023-10-05 MED ORDER — ONDANSETRON HCL 4 MG PO TABS: 4.0000 mg | ORAL_TABLET | ORAL | Status: DC | PRN

## 2023-10-05 MED ORDER — SENNOSIDES-DOCUSATE SODIUM 8.6-50 MG PO TABS
2.0000 | ORAL_TABLET | Freq: Every day | ORAL | Status: DC
  Administered 2023-10-06: 2 via ORAL
  Filled 2023-10-05: qty 2

## 2023-10-05 MED ORDER — DIBUCAINE (PERIANAL) 1 % EX OINT: 1.0000 | TOPICAL_OINTMENT | CUTANEOUS | Status: DC | PRN

## 2023-10-05 MED ORDER — PRENATAL MULTIVITAMIN CH
1.0000 | ORAL_TABLET | Freq: Every day | ORAL | Status: DC
  Administered 2023-10-06: 1 via ORAL
  Filled 2023-10-05: qty 1

## 2023-10-05 MED ORDER — TETANUS-DIPHTH-ACELL PERTUSSIS 5-2.5-18.5 LF-MCG/0.5 IM SUSY: 0.5000 mL | PREFILLED_SYRINGE | Freq: Once | INTRAMUSCULAR | Status: DC

## 2023-10-05 MED ORDER — GUAIFENESIN ER 600 MG PO TB12
600.0000 mg | ORAL_TABLET | Freq: Once | ORAL | Status: AC
  Administered 2023-10-05: 600 mg via ORAL
  Filled 2023-10-05: qty 1

## 2023-10-05 MED ORDER — LIDOCAINE HCL (PF) 1 % IJ SOLN
INTRAMUSCULAR | Status: DC | PRN
  Administered 2023-10-05 (×2): 5 mL via EPIDURAL

## 2023-10-05 MED ORDER — FENTANYL-BUPIVACAINE-NACL 0.5-0.125-0.9 MG/250ML-% EP SOLN
12.0000 mL/h | EPIDURAL | Status: DC | PRN
  Administered 2023-10-05: 12 mL/h via EPIDURAL
  Filled 2023-10-05: qty 250

## 2023-10-05 MED ORDER — LACTATED RINGERS IV SOLN: 500.0000 mL | Freq: Once | INTRAVENOUS | Status: DC

## 2023-10-05 NOTE — Anesthesia Preprocedure Evaluation (Signed)
Anesthesia Evaluation  Patient identified by MRN, date of birth, ID band Patient awake    Reviewed: Allergy & Precautions, NPO status , Patient's Chart, lab work & pertinent test results  Airway Mallampati: II  TM Distance: >3 FB     Dental no notable dental hx. (+) Teeth Intact, Dental Advisory Given   Pulmonary neg pulmonary ROS   Pulmonary exam normal        Cardiovascular negative cardio ROS Normal cardiovascular exam+ Valvular Problems/Murmurs  Rhythm:Regular     Neuro/Psych  PSYCHIATRIC DISORDERS  Depression    negative neurological ROS     GI/Hepatic Neg liver ROS,GERD  ,,  Endo/Other  negative endocrine ROS    Renal/GU Renal diseaseHx/o acute pyelonephritis  negative genitourinary   Musculoskeletal negative musculoskeletal ROS (+)    Abdominal   Peds  Hematology negative hematology ROS (+)   Anesthesia Other Findings   Reproductive/Obstetrics (+) Pregnancy Hx/o version yesterday for transverse lie                             Anesthesia Physical Anesthesia Plan  ASA: 2  Anesthesia Plan: Epidural   Post-op Pain Management: Minimal or no pain anticipated   Induction: Intravenous  PONV Risk Score and Plan: Treatment may vary due to age or medical condition  Airway Management Planned: Natural Airway  Additional Equipment:   Intra-op Plan:   Post-operative Plan:   Informed Consent: I have reviewed the patients History and Physical, chart, labs and discussed the procedure including the risks, benefits and alternatives for the proposed anesthesia with the patient or authorized representative who has indicated his/her understanding and acceptance.       Plan Discussed with: Anesthesiologist  Anesthesia Plan Comments:        Anesthesia Quick Evaluation

## 2023-10-05 NOTE — Progress Notes (Signed)
Painful contractions BP (!) 91/51   Pulse 72   Temp 98.5 F (36.9 C) (Oral)   Resp 19   Ht 5\' 3"  (1.6 m)   Wt 69.9 kg   LMP 12/16/2022   BMI 27.28 kg/m  CE 3/50/-2 TOCO q73min, pitocin at 77mU/min  Continue to titrate, anticipate SVD, still in latent labor, GBS neg. Still declining epidural at this time. States with G2 (unmedicated), took 12hrs from ROM to delivery.

## 2023-10-05 NOTE — Plan of Care (Signed)
Pt demonstrated understanding 

## 2023-10-05 NOTE — Lactation Note (Signed)
This note was copied from a baby's chart. Lactation Consultation Note  Patient Name: Jennifer Randall [id number] Date: 10/05/2023 Age:32 hours Reason for consult: Initial assessment;Term  P3- [name] reports that infant is nursing well and denies any pain or discomfort. [name] states that she is an experienced breastfeeding mom, so she has no questions at this time. Infant last ate at 2:30 pm, so LC encouraged latching infant. LC offered to assist [name] to assess a latch. [name] declined and stated that she will call lactation if she needs assistance.  LC reviewed feeding infant on cue 8-12x in 24 hrs, not allowing infant to go over 3 hrs without a feeding, CDC milk storage guidelines, LC services handout and engorgement/breast care. LC encouraged [name] to call when she needs assistance.  Maternal Data Has patient been taught Hand Expression?: No Does the patient have breastfeeding experience prior to this delivery?: Yes How long did the patient breastfeed?: 3 months with first 9then went to work), 1 year with second  Feeding Mother's Current Feeding Choice: Breast Milk  Lactation Tools Discussed/Used Pump Education: Milk Storage  Interventions Interventions: Breast feeding basics reviewed;Education;LC Services brochure  Discharge Discharge Education: Warning signs for feeding baby;Engorgement and breast care Pump: Hands Free;Personal  Consult Status Consult Status: PRN Date: 10/05/23    Dema Severin BS, IBCLC 10/05/2023, 6:24 PM

## 2023-10-05 NOTE — Progress Notes (Signed)
Abdominal binder applied.

## 2023-10-05 NOTE — Progress Notes (Signed)
TEJUANA BITTER is a 32 y.o. [id number] at [redacted]w[redacted]d  Subjective: Painful contractions  Objective: BP 107/69   Pulse 79   Temp 97.7 F (36.5 C) (Oral)   Resp 18   Ht 5\' 3"  (1.6 m)   Wt 69.9 kg   LMP 12/16/2022   BMI 27.28 kg/m  No intake/output data recorded. No intake/output data recorded.  FHT:  FHR: 120 bpm, variability: moderate,  accelerations:  Present,  decelerations:  Absent UC:   regular, every 1-2 minutes SVE:   Dilation: 4 Effacement (%): 50 Station: -2 Exam by:: Dr Kevin Fenton  Labs: Lab Results  Component Value Date   WBC 12.0 (H) 10/04/2023   HGB 12.1 10/04/2023   HCT 35.8 (L) 10/04/2023   MCV 90.2 10/04/2023   PLT 231 10/04/2023    Assessment / Plan: 32yo [id number] @ 39.3 with IOL for unstable lie at term  Labor:  s/p successful ECV, s/p SROM. On pitocin (14). 3-4 cm, minimal change from prior exam. Increase pitocin Preeclampsia:   N/A Fetal Wellbeing:  Category I Pain Control:  Labor support without medications, considering epidural I/D:  n/a Anticipated MOD:  NSVD  Junius Creamer, DO 10/05/2023, 10:05 AM

## 2023-10-05 NOTE — Lactation Note (Signed)
This note was copied from a baby's chart. Lactation Consultation Note  Patient Name: Jennifer Randall [id number] Date: 10/05/2023 Age:32 hours Reason for consult: Follow-up assessment;Mother's request;Term;Breastfeeding assistance  P3- [name] called LC for assistance because infant still had not latched since LC was last in the room. [name] states that he seems interested, but he would have a difficult time staying latched. [name] placed infant in the cross cradle position on the left breast. Infant was eager to latch, but would then tongue thrust himself off. Infant did this a few times before finally sustaining a latch. [name] verbalized relief. LC reassured [name] that this lack of coordination is normal and will eventually resolve itself. LC encouraged [name] to continue working with infant and allowing him to figure out his tongue movement on his own timeframe. LC also encouraged [name] to hand express EBM if needed and feed that to infant if he will not latch.  Maternal Data Has patient been taught Hand Expression?: No Does the patient have breastfeeding experience prior to this delivery?: Yes How long did the patient breastfeed?: 3 months with first (then went back to work) and 1 year with second  Feeding Mother's Current Feeding Choice: Breast Milk  LATCH Score Latch: Repeated attempts needed to sustain latch, nipple held in mouth throughout feeding, stimulation needed to elicit sucking reflex.  Audible Swallowing: Spontaneous and intermittent  Type of Nipple: Everted at rest and after stimulation  Comfort (Breast/Nipple): Soft / non-tender  Hold (Positioning): No assistance needed to correctly position infant at breast.  LATCH Score: 9   Lactation Tools Discussed/Used Pump Education: Milk Storage  Interventions Interventions: Breast feeding basics reviewed;Education;LC Services brochure  Discharge Discharge Education: Warning signs for feeding baby Pump: Hands Free;Personal  Consult  Status Consult Status: PRN Date: 10/05/23    Dema Severin BS, IBCLC 10/05/2023, 8:13 PM

## 2023-10-05 NOTE — Anesthesia Procedure Notes (Signed)
Epidural Patient location during procedure: OB Start time: 10/05/2023 9:44 AM End time: 10/05/2023 9:52 AM  Staffing Anesthesiologist: Mal Amabile, MD Performed: anesthesiologist   Preanesthetic Checklist Completed: patient identified, IV checked, site marked, risks and benefits discussed, surgical consent, monitors and equipment checked, pre-op evaluation and timeout performed  Epidural Patient position: sitting Prep: DuraPrep and site prepped and draped Patient monitoring: continuous pulse ox and blood pressure Approach: midline Location: L3-L4 Injection technique: LOR air  Needle:  Needle type: Tuohy  Needle gauge: 17 G Needle length: 9 cm and 9 Needle insertion depth: 5 cm Catheter type: closed end flexible Catheter size: 19 Gauge Catheter at skin depth: 10 cm Test dose: negative and Other  Assessment Events: blood not aspirated, no cerebrospinal fluid, injection not painful, no injection resistance, no paresthesia and negative IV test  Additional Notes Patient identified. Risks and benefits discussed including failed block, incomplete  Pain control, post dural puncture headache, nerve damage, paralysis, blood pressure Changes, nausea, vomiting, reactions to medications-both toxic and allergic and post Partum back pain. All questions were answered. Patient expressed understanding and wished to proceed. Sterile technique was used throughout procedure. Epidural site was Dressed with sterile barrier dressing. No paresthesias, signs of intravascular injection Or signs of intrathecal spread were encountered.  Patient was more comfortable after the epidural was dosed. Please see RN's note for documentation of vital signs and FHR which are stable.  Reason for block:procedure for pain

## 2023-10-06 LAB — CBC
HCT: 33.8 % — ABNORMAL LOW (ref 36.0–46.0)
Hemoglobin: 11.3 g/dL — ABNORMAL LOW (ref 12.0–15.0)
MCH: 30.4 pg (ref 26.0–34.0)
MCHC: 33.4 g/dL (ref 30.0–36.0)
MCV: 90.9 fL (ref 80.0–100.0)
Platelets: 221 10*3/uL (ref 150–400)
RBC: 3.72 MIL/uL — ABNORMAL LOW (ref 3.87–5.11)
RDW: 13.3 % (ref 11.5–15.5)
WBC: 12.1 10*3/uL — ABNORMAL HIGH (ref 4.0–10.5)
nRBC: 0 % (ref 0.0–0.2)

## 2023-10-06 MED ORDER — IBUPROFEN 600 MG PO TABS: 600.0000 mg | ORAL_TABLET | Freq: Four times a day (QID) | ORAL | 0 refills | Status: AC

## 2023-10-06 NOTE — Discharge Summary (Signed)
Postpartum Discharge Summary  Date of Service updated     Patient Name: Jennifer Randall DOB: 05/16/1991 MRN: [id number]  Date of admission: 10/04/2023 Delivery date:10/05/2023 Delivering provider: Truett Perna A Date of discharge: 10/06/2023  Admitting diagnosis: Transverse lie of fetus [O32.2XX0] Intrauterine pregnancy: [redacted]w[redacted]d     Secondary diagnosis:  Principal Problem:   Transverse lie of fetus  Additional problems: N/A    Discharge diagnosis: Term Pregnancy Delivered                                              Post partum procedures: N/A Augmentation: Pitocin Complications: None  Hospital course: Induction of Labor With Vaginal Delivery   32 y.o. yo [id number] at [redacted]w[redacted]d was admitted to the hospital 10/04/2023 for induction of labor.  Indication for induction: Unstable lie.  On admission, ultrasound confirmed transverse lie. She underwent successful external cephalic version and then began induction.  Membrane Rupture Time/Date: 8:47 PM,10/04/2023  Delivery Method:Vaginal, Spontaneous Operative Delivery:N/A Episiotomy: None Lacerations:  Labial;1st degree;Perineal Details of delivery can be found in separate delivery note.  Patient had an uncomplicated postpartum course. Patient is discharged home 10/06/23.  Newborn Data: Birth date:10/05/2023 Birth time:11:34 AM Gender:Female Living status:Living Apgars:8 ,9  Weight:3390 g  Magnesium Sulfate received: No BMZ received: No Rhophylac:N/A MMR:N/A T-DaP:Given prenatally Flu: No RSV Vaccine received: No Transfusion:No Immunizations administered: Immunization History  Administered Date(s) Administered   Influenza-Unspecified 08/15/2018   Tdap 03/21/2016, 12/17/2018, 07/25/2023    Physical exam  Vitals:   10/05/23 1430 10/05/23 1809 10/05/23 2157 10/06/23 0527  BP: 92/61 105/71 102/63 103/73  Pulse: 66 67 73 72  Resp: 17 18 17 17   Temp: 97.8 F (36.6 C) 98.1 F (36.7 C) 98.2 F (36.8 C) 98.3 F (36.8 C)   TempSrc:  Oral Oral Oral  SpO2: 99% 100% 100% 100%  Weight:      Height:       General: alert, cooperative, and no distress Lochia: appropriate Uterine Fundus: firm Incision: N/A DVT Evaluation: No evidence of DVT seen on physical exam. No significant calf/ankle edema. Labs: Lab Results  Component Value Date   WBC 12.1 (H) 10/06/2023   HGB 11.3 (L) 10/06/2023   HCT 33.8 (L) 10/06/2023   MCV 90.9 10/06/2023   PLT 221 10/06/2023      Latest Ref Rng & Units 03/22/2021    1:55 PM  CMP  Glucose 70 - 99 mg/dL 95   BUN 6 - 20 mg/dL 5   Creatinine 1.47 - 8.29 mg/dL 5.62   Sodium 130 - 865 mmol/L 138   Potassium 3.5 - 5.1 mmol/L 3.5   Chloride 98 - 111 mmol/L 105   CO2 22 - 32 mmol/L 26   Calcium 8.9 - 10.3 mg/dL 8.5   Total Protein 6.5 - 8.1 g/dL 6.0   Total Bilirubin 0.3 - 1.2 mg/dL 0.7   Alkaline Phos 38 - 126 U/L 40   AST 15 - 41 U/L 13   ALT 0 - 44 U/L 15    Edinburgh Score:    10/06/2023    8:54 AM  Edinburgh Postnatal Depression Scale Screening Tool  I have been able to laugh and see the funny side of things. 0  I have looked forward with enjoyment to things. 1  I have blamed myself unnecessarily when things went wrong. 2  I have  been anxious or worried for no good reason. 2  I have felt scared or panicky for no good reason. 1  Things have been getting on top of me. 2  I have been so unhappy that I have had difficulty sleeping. 0  I have felt sad or miserable. 1  I have been so unhappy that I have been crying. 0  The thought of harming myself has occurred to me. 0  Edinburgh Postnatal Depression Scale Total 9      After visit meds:  Allergies as of 10/06/2023       Reactions   Sulfa Antibiotics Hives        Medication List     TAKE these medications    ibuprofen 600 MG tablet Commonly known as: ADVIL Take 1 tablet (600 mg total) by mouth every 6 (six) hours.         Discharge home in stable condition Infant Feeding: Breast Infant  Disposition:home with mother Discharge instruction: per After Visit Summary and Postpartum booklet. Activity: Advance as tolerated. Pelvic rest for 6 weeks.  Diet: routine diet Anticipated Birth Control: Unsure Postpartum Appointment:6 weeks Additional Postpartum F/U: Postpartum Depression checkup Future Appointments:No future appointments. Follow up Visit:  Follow-up Information     Ob/Gyn, [facility] Follow up in 6 week(s).   Contact information: [address] [place] [address] [phone]                     10/06/2023 Junius Creamer, DO

## 2023-10-06 NOTE — Discharge Instructions (Signed)
Call office with any concerns [phone]

## 2023-10-06 NOTE — Lactation Note (Signed)
This note was copied from a baby's chart. Lactation Consultation Note  Patient Name: Jennifer Randall [id number] Date: 10/06/2023 Age:32 hours  Reason for consult: Follow-up assessment;Term;Nipple pain/trauma  P3, [redacted]w[redacted]d, 2% weight loss  Mother requested LC assistance with latch due to sore nipples. Mother states baby has been sleepy and is now waking up after his circumcision. "[name]" is 49 hours old. Mother had baby latched upon arrival to room and said latch was very painful. Baby was taken off the breast by mother. Her nipple was pinched, sucking blister noted and she complained of pain.   Basic breastfeeding education with baby in cross cradle hold. Mother was instructed to hold her breast so baby can get a deep latch and do breast compression to keep baby engaged and not slip away from the breast. Mother felt the latch was not painful. Instructed to get OP LC assistance if latch remains painful.  Mom made aware of O/P services, breastfeeding support groups,  and our phone # for post-discharge questions.       Feeding Mother's Current Feeding Choice: Breast Milk  LATCH Score Latch: Grasps breast easily, tongue down, lips flanged, rhythmical sucking.  Audible Swallowing: Spontaneous and intermittent  Type of Nipple: Everted at rest and after stimulation  Comfort (Breast/Nipple): Filling, red/small blisters or bruises, mild/mod discomfort  Hold (Positioning): Assistance needed to correctly position infant at breast and maintain latch.  LATCH Score: 8    Interventions Interventions: Breast feeding basics reviewed;Assisted with latch;Breast compression;Adjust position;Support pillows;Expressed milk;Education;Comfort gels  Discharge Discharge Education: Warning signs for feeding baby;Engorgement and breast care Pump: Hands Free;Personal  Consult Status Consult Status: Complete Date: 10/06/23    Omar Person 10/06/2023, 2:47 PM

## 2023-10-06 NOTE — Progress Notes (Signed)
Post Partum Day 1 Subjective: Cramping with breastfeeding painful, but meds helping.  Baby had shallow latch yesterday so nipples painful, but improving. Lochia light.   Baby needs repeat hearing test today  Objective: Blood pressure 103/73, pulse 72, temperature 98.3 F (36.8 C), temperature source Oral, resp. rate 17, height 5\' 3"  (1.6 m), weight 69.9 kg, last menstrual period 12/16/2022, SpO2 100%, unknown if currently breastfeeding.  Physical Exam:  General: alert, cooperative, and no distress Lochia: appropriate Uterine Fundus: firm, at umbilicus Incision: N/A DVT Evaluation: trace edema bilaterally, no calf tenderness or cords  Recent Labs    10/04/23 1518 10/06/23 0527  HGB 12.1 11.3*  HCT 35.8* 33.8*    Assessment/Plan: 32 yo G3P3 PPD1 s/p NSVD  - PP: Doing well. From a maternal standpoint, no barriers to discharge today. Will await peds evaluation - They desire circumcision prior to DC  Discussed r/b/a of the procedure.  Reviewed that circumcision is an elective surgical procedure and not considered medically necessary.  Reviewed the risks of the procedure including the risk of infection, bleeding, damage to surrounding structures, including scrotum, shaft, urethra and head of penis, and an undesired cosmetic effect requiring additional procedures for revision.    LOS: 2 days   Junius Creamer, DO 10/06/2023, 8:36 AM

## 2023-10-06 NOTE — Progress Notes (Signed)
CSW received consult for hx of Depression. CSW met with Jennifer Randall to offer support and complete assessment. When CSW entered room, Jennifer Randall was observed laying in hospital bed holding infant. Jennifer Randall's two other children and FOB were present sitting nearby. Jennifer Randall provided verbal consent to speak in front of FOB about anything. CSW introduced self and reason for consult. Jennifer Randall presented as calm and remained engaged throughout consult.  CSW inquired how Jennifer Randall is feeling emotionally since infant's birth. Jennifer Randall reports she is feeling "good." CSW inquired about Jennifer Randall's mental health history. Jennifer Randall acknowledges a history of depression, reporting she was first diagnosed in 2016. Jennifer Randall recalls that she was prescribed an antidepressant in the past but shares "it has been a long time." Jennifer Randall reports she has not attended therapy in the past and declined additional outpatient mental health resources at this time. Jennifer Randall denies a history of postpartum depression/anxiety after the birth of her other 2 children. Jennifer Randall identified FOB and friends as supports. Jennifer Randall denied current SI/HI. DV was not assessed due to FOB being present.  CSW provided education regarding the baby blues period vs. perinatal mood disorders, discussed treatment and gave resources for mental health follow up if concerns arise.  CSW recommends self-evaluation during the postpartum time period using the New Mom Checklist from Postpartum Progress and encouraged Jennifer Randall to contact a medical professional if symptoms are noted at any time.    CSW provided review of Sudden Infant Death Syndrome (SIDS) precautions.  Jennifer Randall reports she has all needed items for infant, including a car seat and bassinet. Jennifer Randall has chosen [facility] [employer] [facility] for infant's follow up care.  CSW identifies no further need for intervention and no barriers to discharge at this time.  Signed,  Norberto Sorenson, MSW, LCSWA, LCASA 10/10/2022 5:11 PM

## 2023-10-07 NOTE — Anesthesia Postprocedure Evaluation (Signed)
Anesthesia Post Note  Patient: Jennifer Randall  Procedure(s) Performed: AN AD HOC LABOR EPIDURAL     Patient location during evaluation: Mother Baby Anesthesia Type: Epidural Level of consciousness: awake Pain management: satisfactory to patient Vital Signs Assessment: post-procedure vital signs reviewed and stable Respiratory status: spontaneous breathing Cardiovascular status: stable Anesthetic complications: no  No notable events documented.  Last Vitals:  Vitals:   10/05/23 2157 10/06/23 0527  BP: 102/63 103/73  Pulse: 73 72  Resp: 17 17  Temp: 36.8 C 36.8 C  SpO2: 100% 100%    Last Pain:  Vitals:   10/06/23 0854  TempSrc:   PainSc: 0-No pain   Pain Goal:                   Cephus Shelling

## 2023-10-12 ENCOUNTER — Telehealth (HOSPITAL_COMMUNITY): Payer: Self-pay

## 2023-10-12 NOTE — Telephone Encounter (Signed)
 10/12/2023 1844  Name: Jennifer Randall MRN: [id number] DOB: 08-22-1991  Reason for Call:  Transition of Care Hospital Discharge Call  Contact Status: Patient Contact Status: Message  Language assistant needed:          Follow-Up Questions:    Van Postnatal Depression Scale:  In the Past 7 Days:    PHQ2-9 Depression Scale:     Discharge Follow-up:    Post-discharge interventions: NA  Signature  Rosaline Deretha PEAK

## 2023-12-03 ENCOUNTER — Encounter: Payer: Self-pay | Admitting: Internal Medicine

## 2024-01-18 DIAGNOSIS — Z419 Encounter for procedure for purposes other than remedying health state, unspecified: Secondary | ICD-10-CM | POA: Diagnosis not present

## 2024-02-05 NOTE — Progress Notes (Signed)
 Chief Complaint  Patient presents with   Vaginal Discharge    Vaginal discharge that is yellow/greenish, no odor. Also has burning and itching-also some white clumps. Symptoms began a couple of days ago. Asking for a pregnancy test due to change in breast milk.   Had baby boy 10/05/23, via NSVD. She reports that everything was checked and fine at her 6 week check. Her tear reportedly had completely healed. She had mentioned some ongoing vaginal discharge at that time, but wasn't discolored.  She noticed a change to the color of the discharge about 2 weeks ago. It is now yellow-green. Sees some white clumps in the underwear. No odor, discomfort or itching then. Itching and burning started in the external vaginal area a couple of days ago. Feels a little raw, irritated.  Denies pelvic pain or pain with intercourse. She is breastfeeding. Not using any contraception. Hasn't had any bleeding. Denies any concerns for STD.    PMH, PSH, SH reviewed  Outpatient Encounter Medications as of 02/06/2024  Medication Sig   ibuprofen  (ADVIL ) 600 MG tablet Take 1 tablet (600 mg total) by mouth every 6 (six) hours. (Patient not taking: Reported on 02/06/2024)   No facility-administered encounter medications on file as of 02/06/2024.   Allergies  Allergen Reactions   Sulfa Antibiotics Hives     ROS: no f/c, URI symptoms, dysuria, rashes, bleeding or bruising. No menses since childbirth; breastfeeding. Vaginal complaints per HPI.    PHYSICAL EXAM:  BP 100/60   Pulse 80   Temp 97.9 F (36.6 C) (Tympanic)   Ht 5\' 3"  (1.6 m)   Wt 126 lb 3.2 oz (57.2 kg)   Breastfeeding Yes   BMI 22.36 kg/m    Pleasant, well-appearing female, in no distress. Accompanied by baby and 4 yo daughter. HEENT: conjunctiva and sclera are clear, EOMI Neck: no lymphadenopathy or mass Heart: regular rate and rhythm Lungs: clear Back: no CVA tenderness Abdomen; soft, nontender GU: very small area of erythema along  posterior introitus. No lesions. Some thick white discharge noted, as well as creamy, very slight yellow discharge within the vaginal vault.  Cervix appears normal.  Urine pregnancy negative   ASSESSMENT/PLAN:  Vaginal discharge - appears creamy, not yellow-green as per pt. NuSwab sent to r/o BV or other infection, suspect poss yeast also. - Plan: NuSwab Vaginitis Plus (VG+)  Vaginal itching - some thick discharge and external irritation. Likely has component of yeast. Will treat presumptiely while waiting for Nuswbab results - Plan: NuSwab Vaginitis Plus (VG+)  Yeast vaginitis - Plan: fluconazole  (DIFLUCAN ) 150 MG tablet  Amenorrhea - due to breast feeding. Not using contraception, pt requested pregnancy test. Encouraged contraception/condoms - Plan: POCT urine pregnancy  Additional 5-10 mins spent talking with her about care for her infant--she was asking about our vaccine policies, reporting some vaccine hesitancy. He has had all vaccines except the 2nd dose of Hep B. Concerned about aluminum. She feels isolated from pediatrician offices, who won't see them as patients if they don't exactly follow all of CDC guidelines re: vaccines.   All questions answered to best of my ability.    I spent 35 minutes dedicated to the care of this patient, including pre-visit review of records, face to face time, post-visit ordering of testing and documentation.

## 2024-02-06 ENCOUNTER — Ambulatory Visit: Payer: Self-pay | Admitting: Family Medicine

## 2024-02-06 ENCOUNTER — Encounter: Payer: Self-pay | Admitting: Family Medicine

## 2024-02-06 VITALS — BP 100/60 | HR 80 | Temp 97.9°F | Ht 63.0 in | Wt 126.2 lb

## 2024-02-06 DIAGNOSIS — N898 Other specified noninflammatory disorders of vagina: Secondary | ICD-10-CM | POA: Diagnosis not present

## 2024-02-06 DIAGNOSIS — N912 Amenorrhea, unspecified: Secondary | ICD-10-CM | POA: Diagnosis not present

## 2024-02-06 DIAGNOSIS — B3731 Acute candidiasis of vulva and vagina: Secondary | ICD-10-CM

## 2024-02-06 LAB — POCT URINE PREGNANCY: Preg Test, Ur: NEGATIVE

## 2024-02-06 MED ORDER — FLUCONAZOLE 150 MG PO TABS: 150.0000 mg | ORAL_TABLET | Freq: Once | ORAL | 0 refills | Status: AC

## 2024-02-10 ENCOUNTER — Encounter: Payer: Self-pay | Admitting: Family Medicine

## 2024-02-12 ENCOUNTER — Encounter: Payer: Self-pay | Admitting: Family Medicine

## 2024-02-12 LAB — NUSWAB VAGINITIS PLUS (VG+)
Candida albicans, NAA: NEGATIVE
Candida glabrata, NAA: NEGATIVE

## 2024-02-17 DIAGNOSIS — Z419 Encounter for procedure for purposes other than remedying health state, unspecified: Secondary | ICD-10-CM | POA: Diagnosis not present

## 2024-02-28 ENCOUNTER — Ambulatory Visit (HOSPITAL_COMMUNITY): Payer: Self-pay

## 2024-03-19 DIAGNOSIS — Z419 Encounter for procedure for purposes other than remedying health state, unspecified: Secondary | ICD-10-CM | POA: Diagnosis not present

## 2024-04-18 DIAGNOSIS — Z419 Encounter for procedure for purposes other than remedying health state, unspecified: Secondary | ICD-10-CM | POA: Diagnosis not present

## 2024-05-19 DIAGNOSIS — Z419 Encounter for procedure for purposes other than remedying health state, unspecified: Secondary | ICD-10-CM | POA: Diagnosis not present

## 2024-06-19 DIAGNOSIS — Z419 Encounter for procedure for purposes other than remedying health state, unspecified: Secondary | ICD-10-CM | POA: Diagnosis not present

## 2024-07-19 ENCOUNTER — Encounter (HOSPITAL_COMMUNITY): Payer: Self-pay | Admitting: *Deleted

## 2024-07-19 ENCOUNTER — Other Ambulatory Visit: Payer: Self-pay

## 2024-07-19 ENCOUNTER — Ambulatory Visit (HOSPITAL_COMMUNITY)
Admission: EM | Admit: 2024-07-19 | Discharge: 2024-07-19 | Disposition: A | Discharge status: Home / Self Care | Attending: Emergency Medicine | Admitting: Emergency Medicine

## 2024-07-19 ENCOUNTER — Ambulatory Visit (INDEPENDENT_AMBULATORY_CARE_PROVIDER_SITE_OTHER)

## 2024-07-19 DIAGNOSIS — M25511 Pain in right shoulder: Secondary | ICD-10-CM | POA: Diagnosis not present

## 2024-07-19 DIAGNOSIS — M898X1 Other specified disorders of bone, shoulder: Secondary | ICD-10-CM | POA: Diagnosis not present

## 2024-07-19 DIAGNOSIS — Z3202 Encounter for pregnancy test, result negative: Secondary | ICD-10-CM

## 2024-07-19 DIAGNOSIS — R10A2 Flank pain, left side: Secondary | ICD-10-CM | POA: Insufficient documentation

## 2024-07-19 LAB — POCT URINALYSIS DIP (MANUAL ENTRY)
Bilirubin, UA: NEGATIVE
Glucose, UA: NEGATIVE mg/dL
Ketones, POC UA: NEGATIVE mg/dL
Leukocytes, UA: NEGATIVE
Nitrite, UA: NEGATIVE
Protein Ur, POC: NEGATIVE mg/dL — AB
Spec Grav, UA: 1.01 (ref 1.010–1.025)
Urobilinogen, UA: 0.2 U/dL
pH, UA: 6 (ref 5.0–8.0)

## 2024-07-19 LAB — POCT URINE PREGNANCY: Preg Test, Ur: NEGATIVE

## 2024-07-19 MED ORDER — DEXAMETHASONE SOD PHOSPHATE PF 10 MG/ML IJ SOLN
10.0000 mg | Freq: Once | INTRAMUSCULAR | Status: AC
  Administered 2024-07-19: 10 mg via INTRAMUSCULAR

## 2024-07-19 MED ORDER — BACLOFEN 10 MG PO TABS: 10.0000 mg | ORAL_TABLET | Freq: Three times a day (TID) | ORAL | 0 refills | Status: AC

## 2024-07-19 NOTE — Discharge Instructions (Addendum)
 Your x-rays are negative for any underlying fracture or dislocation. We have given you an injection of Decadron  in clinic which is a steroid to help with inflammation related to your pain.  A one-time dose of this is not known to cause any side effects with breast-feeding. You can take baclofen every 8 hours as needed for muscle pain and spasms.  This has also not been shown to cause any side effects in breast-fed infants while breast-feeding. Otherwise she can alternate between 650 mg of Tylenol  and 400 to 600 mg ibuprofen  every 6-8 hours as needed for pain. Alternate between ice and heat and do some gentle stretching to help with pain.  Your urinalysis did not reveal any obvious signs of urinary tract infection.  We will send a urine culture to confirm due to your history of recurrent kidney infections.  If these results determine that you do need treatment related to this someone will call to let you know and send  antibiotics if needed.  Otherwise follow-up with your primary care provider or return here as needed.

## 2024-07-19 NOTE — ED Provider Notes (Signed)
 [facility]    CSN: [id number] Arrival date & time: 07/19/24  1004      History   Chief Complaint Chief Complaint  Patient presents with   Clavicle Injury    HPI Jennifer Randall is a 33 y.o. female.   Patient presents with right sided clavicle pain, right shoulder pain, and right sided neck pain after picking her 25 baby out of the tub last night and when she turned she felt a sharp pain in her clavicle that radiated to her shoulder.  Patient states that she noticed a raised area over her right clavicle and is concerned that she may have injured this.  Patient states that she is also had left-sided dull flank pain for about 1 week.  Patient states that she does have a history of pyelonephritis and is usually just presents with flank pain and would like her urine to be checked today to rule this out.  Patient denies dysuria, hematuria, frequency/urgency, nausea, vomiting, and fever.  LMP 9/28.  Patient is currently breast-feeding.  The history is provided by the patient and medical records.    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   Fetal malpresentation 10/01/2023   Pelvic inflammatory disease 10/19/2020   Acute pyelonephritis 10/18/2020   Sepsis (HCC) 10/18/2020   Vaginal discharge 10/18/2020   Septic shock (HCC)    Fever    Flank pain    Pelvic pain     Past Surgical History:  Procedure Laterality Date   DILATION AND EVACUATION N/A 03/22/2021   Procedure: DILATATION AND EVACUATION;  Surgeon: Gretta Gums, MD;  Location: [facility];  Service: Gynecology;  Laterality: N/A;   EXTERNAL CEPHALIC VERSION  10/04/2023   TONSILLECTOMY     TYMPANOSTOMY TUBE PLACEMENT      OB History     Gravida  5   Para  3   Term  3   Preterm  0   AB  2   Living  3      SAB  2   IAB  0   Ectopic  0   Multiple  0   Live Births  3             Home Medications    Prior to Admission medications   Medication Sig Start Date End Date Taking? Authorizing Provider  baclofen (LIORESAL) 10 MG tablet Take 1 tablet (10 mg total) by mouth 3 (three) times daily. 07/19/24  Yes Johnie, Giulliana Mcroberts A, NP  ibuprofen  (ADVIL ) 600 MG tablet Take 1 tablet (600 mg total) by mouth every 6 (six) hours. Patient not taking: Reported on 02/06/2024 10/06/23   Claudene Larraine LABOR, DO    Family History Family History  Problem Relation Age of Onset   Cancer Father    Hyperlipidemia Maternal Grandmother     Social History Social History   Tobacco Use   Smoking status: Never   Smokeless tobacco: Never  Vaping Use   Vaping status: Never Used  Substance Use Topics   Alcohol use: Not Currently    Alcohol/week: 0.0 standard drinks of alcohol   Drug use: No     Allergies   Sulfa antibiotics   Review of Systems Review of Systems  Per HPI  Physical Exam Triage Vital Signs ED Triage Vitals  Encounter Vitals Group  BP 07/19/24 1034 100/67     Girls Systolic BP Percentile --      Girls Diastolic BP Percentile --      Boys Systolic BP Percentile --      Boys Diastolic BP Percentile --      Pulse Rate 07/19/24 1034 80     Resp 07/19/24 1034 18     Temp 07/19/24 1034 98.3 F (36.8 C)     Temp src --      SpO2 07/19/24 1034 98 %     Weight --      Height --      Head Circumference --      Peak Flow --      Pain Score 07/19/24 1031 8     Pain Loc --      Pain Education --      Exclude from Growth Chart --    No data found.  Updated Vital Signs BP 100/67   Pulse 80   Temp 98.3 F (36.8 C)   Resp 18   LMP 07/05/2024 (Approximate)   SpO2 98%   Breastfeeding Yes   Visual Acuity Right Eye Distance:   Left Eye Distance:   Bilateral Distance:    Right Eye Near:   Left Eye Near:    Bilateral Near:     Physical Exam Vitals and nursing note reviewed.  Constitutional:      General: She is awake. She is not in acute  distress.    Appearance: Normal appearance. She is well-developed and well-groomed. She is not ill-appearing.  Chest:     Chest wall: Swelling and tenderness present. No deformity.       Comments: Tenderness and mild swelling noted to sternal end of right clavicle Abdominal:     General: Abdomen is flat. Bowel sounds are normal.     Palpations: Abdomen is soft.     Tenderness: There is no abdominal tenderness. There is no right CVA tenderness or left CVA tenderness.  Musculoskeletal:     Right shoulder: Tenderness present. No swelling or deformity. Decreased range of motion.     Cervical back: Normal. No swelling, deformity, rigidity, tenderness or bony tenderness. No pain with movement. Normal range of motion.     Thoracic back: Normal.     Lumbar back: Normal.     Comments: Tenderness noted over superior right trapezius muscle  Skin:    General: Skin is warm and dry.  Neurological:     Mental Status: She is alert.  Psychiatric:        Behavior: Behavior is cooperative.      UC Treatments / Results  Labs (all labs ordered are listed, but only abnormal results are displayed) Labs Reviewed  POCT URINALYSIS DIP (MANUAL ENTRY) - Abnormal; Notable for the following components:      Result Value   Blood, UA small (*)    Protein Ur, POC negative (*)    All other components within normal limits  POCT URINE PREGNANCY - Normal  URINE CULTURE    EKG   Radiology DG Shoulder Right Result Date: 07/19/2024 CLINICAL DATA:  Right shoulder pain after lifting infant EXAM: RIGHT SHOULDER - 3 VIEW COMPARISON:  None Available. FINDINGS: There is no evidence of fracture or dislocation. There is no evidence of arthropathy or other focal bone abnormality. Soft tissues are unremarkable. IMPRESSION: No acute fracture or dislocation. Electronically Signed   By: Limin  Xu M.D.   On: 07/19/2024 11:33   DG Clavicle  Right Result Date: 07/19/2024 CLINICAL DATA:  Clavicle and shoulder pain after  lifting infant EXAM: RIGHT CLAVICLE - 2 VIEWS COMPARISON:  None Available. FINDINGS: There is no evidence of fracture or other focal bone lesions. Soft tissues are unremarkable. IMPRESSION: No clavicle fracture. Electronically Signed   By: Limin  Xu M.D.   On: 07/19/2024 11:31    Procedures Procedures (including critical care time)  Medications Ordered in UC Medications  dexamethasone  (DECADRON ) injection 10 mg (10 mg Intramuscular Given 07/19/24 1149)    Initial Impression / Assessment and Plan / UC Course  I have reviewed the triage vital signs and the nursing notes.  Pertinent labs & imaging results that were available during my care of the patient were reviewed by me and considered in my medical decision making (see chart for details).     Patient is overall well-appearing.  Vitals are stable.  X-rays ordered.  Based my interpretation there is no acute osseous abnormality.  Radiology report confirms this.  Pain likely muscular in nature.  Prescribed baclofen as needed for muscle pain and spasms.  Given one-time injection of Decadron  in clinic to help with inflammation related to pain.  Also recommended Tylenol  ibuprofen  as needed for pain.  Urinalysis unremarkable, will send urine for culture to evaluate due to left flank pain with recurrent history of pyelonephritis.  Deferred antibiotic treatment at this time due to lack of other symptoms and lack of evidence of urinary tract infection at this time.  Discussed follow-up, return, and strict ER precautions. Final Clinical Impressions(s) / UC Diagnoses   Final diagnoses:  Left flank pain  Pain of right clavicle  Acute pain of right shoulder     Discharge Instructions      Your x-rays are negative for any underlying fracture or dislocation. We have given you an injection of Decadron  in clinic which is a steroid to help with inflammation related to your pain.  A one-time dose of this is not known to cause any side effects with  breast-feeding. You can take baclofen every 8 hours as needed for muscle pain and spasms.  This has also not been shown to cause any side effects in breast-fed infants while breast-feeding. Otherwise she can alternate between 650 mg of Tylenol  and 400 to 600 mg ibuprofen  every 6-8 hours as needed for pain. Alternate between ice and heat and do some gentle stretching to help with pain.  Your urinalysis did not reveal any obvious signs of urinary tract infection.  We will send a urine culture to confirm due to your history of recurrent kidney infections.  If these results determine that you do need treatment related to this someone will call to let you know and send  antibiotics if needed.  Otherwise follow-up with your primary care provider or return here as needed.     ED Prescriptions     Medication Sig Dispense Auth. Provider   baclofen (LIORESAL) 10 MG tablet Take 1 tablet (10 mg total) by mouth 3 (three) times daily. 30 each Johnie Rumaldo LABOR, NP      PDMP not reviewed this encounter.   Johnie Rumaldo A, NP 07/19/24 1158

## 2024-07-19 NOTE — ED Triage Notes (Signed)
 PT reports when she getting her 20lb infant out of tub last night she turned and felt a sharpe pin that radiated into RT shoulder . Pt has a raised area on RT clavicle . Today the pain is on the clavicle site to Rt shoulder and up RT side of neck. PT also reports upper back pain.

## 2024-07-20 LAB — URINE CULTURE: Culture: NO GROWTH

## 2024-07-21 ENCOUNTER — Ambulatory Visit (HOSPITAL_COMMUNITY): Payer: Self-pay

## 2024-09-18 ENCOUNTER — Encounter: Payer: Self-pay | Admitting: Family Medicine

## 2024-09-18 ENCOUNTER — Telehealth: Admitting: Family Medicine

## 2024-09-18 VITALS — Wt 119.4 lb

## 2024-09-18 DIAGNOSIS — B3731 Acute candidiasis of vulva and vagina: Secondary | ICD-10-CM | POA: Diagnosis not present

## 2024-09-18 DIAGNOSIS — K5900 Constipation, unspecified: Secondary | ICD-10-CM | POA: Diagnosis not present

## 2024-09-18 MED ORDER — FLUCONAZOLE 150 MG PO TABS: ORAL_TABLET | ORAL | 0 refills | Status: AC

## 2024-09-18 NOTE — Progress Notes (Signed)
° °  Name: Jennifer Randall   Date of Visit: 09/18/2024   Date of last visit with me: Visit date not found   CHIEF COMPLAINT:  Chief Complaint  Patient presents with   Acute Visit    Vaginal burning, itching, rash. Started about 1 week, was using monistat 7 day treatment on day 6 not working,    other     Telehealth video visit conducted via video Both patient and physician are located in the state of [place]  HPI:  Discussed the use of AI scribe software for clinical note transcription with the patient, who gave verbal consent to proceed.  History of Present Illness Jennifer Randall is a 33 year old female who presents with symptoms of a severe yeast infection.  She describes the current yeast infection as the worst she has experienced, with symptoms including a red, itchy rash extending beyond the usual area, irritation, and cracking of the skin around the vaginal and anal areas, accompanied by some bleeding and sensitivity. She has been using Monistat 7 for six days without improvement.  She experiences discomfort during bowel movements due to cracking and tearing in the perineal area, along with anal discharge occurring during bowel movements. No pain with urination, but there is discomfort due to skin irritation. She reports that her water intake could be better and that she has been drinking more water recently, noting that she was probably a little dehydrated before.     OBJECTIVE:       06/18/2022   10:01 AM  Depression screen PHQ 2/9  Decreased Interest 0  Down, Depressed, Hopeless 0  PHQ - 2 Score 0     BP Readings from Last 3 Encounters:  07/19/24 100/67  02/06/24 100/60  10/06/23 103/73    Wt 119 lb 6.4 oz (54.2 kg)   LMP 09/18/2024   Breastfeeding Yes   BMI 21.15 kg/m    Physical Exam    Physical Exam Constitutional:      Appearance: Normal appearance.  Neurological:     General: No focal deficit present.     Mental Status: She is alert and oriented  to person, place, and time. Mental status is at baseline.     ASSESSMENT/PLAN:   Assessment & Plan Yeast vaginitis  Constipation, unspecified constipation type    Assessment and Plan Assessment & Plan Acute candidiasis of vulva and vagina Recurrent yeast infection with severe symptoms. Previous Monistat treatment ineffective. Diflucan  effective in past. - Prescribed Diflucan , one pill initially, second dose if symptoms persist. - Advised over-the-counter topical clotrimazole for rash. - Instructed to return for further evaluation if symptoms persist.  Constipation Symptoms suggestive of constipation, possible dehydration contributing. Discussed water and fiber role. - Increase water intake to 65 ounces daily. - Take Metamucil twice daily for one week. - Advised to contact clinic if symptoms persist.     Jadore Mcguffin A. Vita MD [facility]

## 2024-09-23 ENCOUNTER — Ambulatory Visit: Admitting: Family Medicine

## 2024-09-23 VITALS — BP 110/68 | HR 72 | Ht 60.0 in | Wt 120.4 lb

## 2024-09-23 DIAGNOSIS — N760 Acute vaginitis: Secondary | ICD-10-CM

## 2024-09-23 MED ORDER — AZITHROMYCIN 500 MG PO TABS: 500.0000 mg | ORAL_TABLET | Freq: Every day | ORAL | 0 refills | Status: AC

## 2024-09-23 NOTE — Progress Notes (Signed)
° °  Subjective:    Patient ID: Jennifer Randall, female    DOB: [birth year], 33 y.o.   MRN: [id number]  HPI She is here for evaluation of continued difficulty with vaginal irritation.  She treated initially with Monistat and did not succeed.  She was then seen virtually and given Diflucan  which she again apparently said did not work.  She also was using over-the-counter medications including Preparation H recently she was treated for pinworms as her children also have that.  She has been using   Review of Systems     Objective:    Physical Exam Vaginal exam showed erythema to the labia as well as surrounding tissue and to a lesser extent near the anus.  No true hemorrhoid was seen.  KOH and wet prep was negative.       Assessment & Plan:  Acute vaginitis - Plan: azithromycin  (ZITHROMAX ) 500 MG tablet Also triple antibiotic ointment given to help sooth the area.  I think this is more of vaginal irritation and possible low-grade infection and hopefully the azithromycin  and then the triple antibiotic will alleviate this.

## 2024-10-22 ENCOUNTER — Ambulatory Visit: Payer: Self-pay

## 2024-10-22 NOTE — Telephone Encounter (Signed)
" °  FYI Only or Action Required?: FYI only for provider: appointment scheduled on 1/16.  Patient was last seen in primary care on 09/23/2024 by Joyce Norleen BROCKS, MD.  Called Nurse Triage reporting Abdominal Pain.  Symptoms began several weeks ago.  Interventions attempted: OTC medications: mirilax.  Symptoms are: gradually worsening.  Triage Disposition: See Physician Within 24 Hours  Patient/caregiver understands and will follow disposition?: Yes  Reason for Triage: Pt called in stating that she has been having abdominal pain and constipation for several weeks. Pt stated that she doesn't see any blood when she goes to make a bowel movement. Pt didn't mention any severe pain at the moment but at times when she is laying down the pain travels from her abdominal area to her back where her kidneys are located. Pt mentioned that one time she felt a sharp pain in her stomach but when she went to lay down it went back in. Pt is concerned about a potential hernia.   Reason for Disposition  [1] MODERATE pain (e.g., interferes with normal activities) AND [2] pain comes and goes (cramps) AND [3] present > 24 hours  (Exception: Pain with Vomiting or Diarrhea - see that Guideline.)  Answer Assessment - Initial Assessment Questions 1. LOCATION: Where does it hurt?      General abd 2. RADIATION: Does the pain shoot anywhere else? (e.g., chest, back)     To back 3. ONSET: When did the pain begin? (e.g., minutes, hours or days ago)      Few weeks ago 4. SUDDEN: Gradual or sudden onset?     gradual 5. PATTERN Does the pain come and go, or is it constant?     intermittent 6. SEVERITY: How bad is the pain?  (e.g., Scale 1-10; mild, moderate, or severe)     Moderate/ severe at times 7. RECURRENT SYMPTOM: Have you ever had this type of stomach pain before? If Yes, ask: When was the last time? and What happened that time?      no 8. CAUSE: What do you think is causing the stomach pain?  (e.g., gallstones, recent abdominal surgery)     unknown 9. RELIEVING/AGGRAVATING FACTORS: What makes it better or worse? (e.g., antacids, bending or twisting motion, bowel movement)     no 10. OTHER SYMPTOMS: Do you have any other symptoms? (e.g., back pain, diarrhea, fever, urination pain, vomiting)       constipation 11. PREGNANCY: Is there any chance you are pregnant? When was your last menstrual period?  Protocols used: Abdominal Pain - Female-A-AH  "

## 2024-10-23 ENCOUNTER — Ambulatory Visit: Admitting: Family Medicine

## 2024-10-23 ENCOUNTER — Encounter: Payer: Self-pay | Admitting: Family Medicine

## 2024-10-23 VITALS — BP 98/72 | HR 80 | Wt 117.6 lb

## 2024-10-23 DIAGNOSIS — R1084 Generalized abdominal pain: Secondary | ICD-10-CM

## 2024-10-23 DIAGNOSIS — K5900 Constipation, unspecified: Secondary | ICD-10-CM | POA: Diagnosis not present

## 2024-10-23 DIAGNOSIS — K2970 Gastritis, unspecified, without bleeding: Secondary | ICD-10-CM | POA: Diagnosis not present

## 2024-10-23 MED ORDER — PANTOPRAZOLE SODIUM 40 MG PO TBEC: 40.0000 mg | DELAYED_RELEASE_TABLET | Freq: Two times a day (BID) | ORAL | 0 refills | Status: AC

## 2024-10-23 NOTE — Progress Notes (Signed)
" ° °  Name: Jennifer Randall   Date of Visit: 10/23/24   Date of last visit with me: Visit date not found   CHIEF COMPLAINT:  Chief Complaint  Patient presents with   Acute Visit    Abdominal pain. Been going on for a few weeks, constipation, pain in lower back.        HPI:  Discussed the use of AI scribe software for clinical note transcription with the patient, who gave verbal consent to proceed.  History of Present Illness   Jennifer Randall is a 34 year old female who presents with abdominal pain and constipation. She is accompanied by her child.  She experiences abdominal pain that affects her entire abdomen, sometimes sharp and located under her rib cage, causing a bulging sensation when she breathes in. The pain is persistent, occurring randomly throughout the day and often waking her at night. A specific incident where her child ran into her arms led to a painful knot above her belly button, increasing her concern.  She has constipation, with bowel movements every couple of days. When she feels the urge to defecate, her stomach hurts significantly, and her stools are described as 'corn balls'. She has been taking Miralax  once daily for several days and drinks at least 60 ounces of water daily, but continues to experience symptoms.  Her diet includes three meals a day with two snacks, rich in fiber, fruits, and vegetables. 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 Family history is notable for her mother having irritable bowel syndrome, diarrhea type, although she does not experience diarrhea herself.  No feeling of fullness all the time and no significant change in appetite. She experiences back pain and notes that her urine appears normal.         OBJECTIVE:       06/18/2022   10:01 AM  Depression screen PHQ 2/9  Decreased Interest 0  Down, Depressed, Hopeless 0  PHQ - 2 Score 0     BP Readings from Last 3 Encounters:  10/23/24 98/72   09/23/24 110/68  07/19/24 100/67    BP 98/72   Pulse 80   Wt 117 lb 9.6 oz (53.3 kg)   SpO2 99%   BMI 22.97 kg/m    Physical Exam          Physical Exam  ASSESSMENT/PLAN:   Assessment & Plan Generalized abdominal pain  Constipation, unspecified constipation type  Gastritis without bleeding, unspecified chronicity, unspecified gastritis type    Assessment and Plan    Constipation with generalized abdominal pain Chronic constipation with diffuse abdominal pain, possibly IBS with constipation. Low suspicion for hernia. Consider gut motility issues. - Double Miralax  dosage to morning and evening for a few days. - Maintain high fluid intake. - Continue fiber-rich diet. - Consider IBS medication if no improvement by Monday. - Consider CT scan of abdomen if no improvement by Monday.  Gastritis Low suspicion of hernia or acute pathology. - Prescribed pantoprazole  (Protonix ) twice daily for two weeks. - Advised over-the-counter pantoprazole  if cost-effective. - Instructed to report symptoms via MyChart by Monday.         Anicka Stuckert A. Vita MD [facility] "

## 2024-10-23 NOTE — Patient Instructions (Signed)
 VISIT SUMMARY:  During your visit, we discussed your abdominal pain and constipation. You described experiencing sharp pain in your abdomen, particularly under your rib cage, and constipation with infrequent bowel movements. We reviewed your diet, fluid intake, and family history.  YOUR PLAN:  -CONSTIPATION WITH GENERALIZED ABDOMINAL PAIN: Constipation is when you have infrequent or difficult bowel movements, which can cause abdominal pain. We recommend doubling your Miralax  dosage to morning and evening for a few days, maintaining high fluid intake, and continuing your fiber-rich diet. If there is no improvement by Monday, we may consider medication for irritable bowel syndrome (IBS) or a CT scan of your abdomen.  -GASTRITIS: Gastritis is the inflammation of the stomach lining, which can cause pain and discomfort. We have prescribed pantoprazole  (Protonix ) to be taken twice daily for two weeks. If cost is a concern, you can use over-the-counter pantoprazole . Please report your symptoms via MyChart by Monday.  INSTRUCTIONS:  Please follow the recommendations for your constipation and gastritis. If there is no improvement in your symptoms by Monday, contact us  for further evaluation, which may include medication for IBS or a CT scan of your abdomen. Report your symptoms via MyChart by Monday.
